# Patient Record
Sex: Male | Born: 1980 | Race: White | Hispanic: No | Marital: Married | Smoking: Former smoker
Health system: Southern US, Community
[De-identification: ages and names within clinical notes are randomized; demographics above are authoritative.]

## PROBLEM LIST (undated history)

## (undated) DIAGNOSIS — Z8619 Personal history of other infectious and parasitic diseases: Secondary | ICD-10-CM

## (undated) DIAGNOSIS — K589 Irritable bowel syndrome without diarrhea: Secondary | ICD-10-CM

## (undated) HISTORY — DX: Personal history of other infectious and parasitic diseases: Z86.19

## (undated) HISTORY — DX: Irritable bowel syndrome without diarrhea: K58.9

---

## 2003-03-03 ENCOUNTER — Emergency Department (HOSPITAL_COMMUNITY): Admission: EM | Admit: 2003-03-03 | Discharge: 2003-03-04 | Payer: Self-pay | Admitting: Emergency Medicine

## 2012-03-02 ENCOUNTER — Encounter (HOSPITAL_COMMUNITY): Payer: Self-pay | Admitting: *Deleted

## 2012-03-02 ENCOUNTER — Emergency Department (HOSPITAL_COMMUNITY)
Admission: EM | Admit: 2012-03-02 | Discharge: 2012-03-02 | Disposition: A | Discharge status: Home / Self Care | Payer: 59 | Source: Home / Self Care | Attending: Family Medicine | Admitting: Family Medicine

## 2012-03-02 DIAGNOSIS — J209 Acute bronchitis, unspecified: Secondary | ICD-10-CM

## 2012-03-02 MED ORDER — HYDROCOD POLST-CHLORPHEN POLST 10-8 MG/5ML PO LQCR: 5.0000 mL | Freq: Two times a day (BID) | ORAL | Status: DC

## 2012-03-02 MED ORDER — MOXIFLOXACIN HCL 400 MG PO TABS: 400.0000 mg | ORAL_TABLET | Freq: Every day | ORAL | Status: DC

## 2012-03-02 NOTE — ED Provider Notes (Signed)
History     CSN: [id number]  Arrival date & time 03/02/12  4098   First MD Initiated Contact with Patient 03/02/12 1006      Chief Complaint  Patient presents with  . Cough    (Consider location/radiation/quality/duration/timing/severity/associated sxs/prior treatment) Patient is a 31 y.o. male presenting with cough. The history is provided by the patient.  Cough This is a new problem. The current episode started more than 1 week ago. The problem has not changed since onset.The cough is non-productive. There has been no fever. Pertinent negatives include no rhinorrhea, no shortness of breath and no wheezing. He is not a smoker. His past medical history is significant for bronchitis.    History reviewed. No pertinent past medical history.  History reviewed. No pertinent past surgical history.  No family history on file.  History  Substance Use Topics  . Smoking status: Never Smoker   . Smokeless tobacco: Not on file  . Alcohol Use: Yes      Review of Systems  Constitutional: Negative.   HENT: Negative for congestion, rhinorrhea and postnasal drip.   Respiratory: Positive for cough. Negative for shortness of breath and wheezing.     Allergies  Penicillins  Home Medications   Current Outpatient Rx  Name  Route  Sig  Dispense  Refill  . NYQUIL PO   Oral   Take by mouth.         Marland Kitchen HYDROCOD POLST-CPM POLST ER 10-8 MG/5ML PO LQCR   Oral   Take 5 mLs by mouth every 12 (twelve) hours. For cough as needed.   115 mL   0   . MOXIFLOXACIN HCL 400 MG PO TABS   Oral   Take 1 tablet (400 mg total) by mouth daily. One tab daily   7 tablet   0     BP 134/84  Pulse 74  Temp 98.9 F (37.2 C) (Oral)  Resp 16  SpO2 98%  Physical Exam  Nursing note and vitals reviewed. Constitutional: He is oriented to person, place, and time. He appears well-developed and well-nourished.  HENT:  Head: Normocephalic.  Right Ear: External ear normal.  Left Ear: External ear  normal.  Mouth/Throat: Oropharynx is clear and moist.  Eyes: Pupils are equal, round, and reactive to light.  Neck: Normal range of motion. Neck supple.  Cardiovascular: Normal rate, normal heart sounds and intact distal pulses.   Pulmonary/Chest: Effort normal and breath sounds normal.  Abdominal: Soft. Bowel sounds are normal.  Lymphadenopathy:    He has no cervical adenopathy.  Neurological: He is alert and oriented to person, place, and time.  Skin: Skin is warm and dry.    ED Course  Procedures (including critical care time)  Labs Reviewed - No data to display No results found.   1. Bronchitis, acute       MDM          Linna Hoff, MD 03/02/12 1018

## 2012-03-02 NOTE — ED Notes (Signed)
Pt  Reports  Symptoms  Of  Cough   Congested           Started  Off  As      sorethroat  Sinus   Congestion  -  For  The  Most  Part  The  Cough  Is  Non  Productive      -  He  Is  Sitting  Upright on  Exam table  Speaking in  Complete  sentances

## 2012-08-16 ENCOUNTER — Encounter: Payer: Self-pay | Admitting: Physician Assistant

## 2012-08-16 ENCOUNTER — Ambulatory Visit (INDEPENDENT_AMBULATORY_CARE_PROVIDER_SITE_OTHER): Payer: 59 | Admitting: Physician Assistant

## 2012-08-16 VITALS — BP 98/60 | HR 64 | Temp 97.3°F | Resp 18 | Ht 70.25 in | Wt 200.0 lb

## 2012-08-16 DIAGNOSIS — K589 Irritable bowel syndrome without diarrhea: Secondary | ICD-10-CM

## 2012-08-16 NOTE — Progress Notes (Signed)
   Patient ID: Zachary Myers MRN: [id number], DOB: 1981/01/18, 32 y.o. Date of Encounter: @DATE @  Chief Complaint:  Chief Complaint  Patient presents with  . new pt est care    HPI: 32 y.o. year old male  presents to establish care. He has no active complaints really-just wanted to know that the next time he has any medical issues he can come here to be seen.  He does occasionally have times when his "stomach gets upset"-has to use bathroom quickly-but no real diarrhea. This does not happen on a regular basis-only occasionally. No abdominal pain, diarrhe, constipation, reflux on routine basis.  He recently had CPE with his work. Had lab work, was all normal. Had Tdap about 2 weeks ago.  Past Medical History  Diagnosis Date  . IBS (irritable bowel syndrome)    Has had no surgeries.    Takes no medications. Home Meds: See attached medication section for current medication list. Any medications entered into computer today will not appear on this note's list. The medications listed below were entered prior to today. No current outpatient prescriptions on file prior to visit.   No current facility-administered medications on file prior to visit.    Allergies:  Allergies  Allergen Reactions  . Penicillins Hives    History   Social History  . Marital Status: Married. Wife works as Associate Professor for [employer] Name: N/A    Number of Children: 1-Son-Infant  . Years of Education: N/A   Occupational History  . Works as Emergency planning/management officer for [employer]   Social History Main Topics  . Smoking status: Former Smoker    Types: Cigarettes    Quit date: 08/16/2009  . Smokeless tobacco: Current User    Types: Chew  . Alcohol Use: Yes-Rare.  . Drug Use: No  . Sexually Active: Yes. Married. Has Infant Son.   Other Topics Concern  . Not on file   Social History Narrative  . No narrative on file    History reviewed. No pertinent family history. Both of his parents are living  with no significant medical history. He has no siblings.   Review of Systems:  See HPI for pertinent ROS. All other ROS negative.    Physical Exam: Blood pressure 98/60, pulse 64, temperature 97.3 F (36.3 C), temperature source Oral, resp. rate 18, height 5' 10.25" (1.784 m), weight 200 lb (90.719 kg)., Body mass index is 28.5 kg/(m^2). General:WNWD WM. Appears in no acute distress. Lungs: Clear bilaterally to auscultation without wheezes, rales, or rhonchi. Breathing is unlabored. Heart: RRR with S1 S2. No murmurs, rubs, or gallops. Abdomen: Soft, non-tender, non-distended with normoactive bowel sounds. No hepatomegaly. No rebound/guarding. No obvious abdominal masses. Musculoskeletal:  Strength and tone normal for age. Extremities/Skin: Warm and dry. No clubbing or cyanosis. No edema. No rashes or suspicious lesions. Neuro: Alert and oriented X 3. Moves all extremities spontaneously. Gait is normal. CNII-XII grossly in tact. Psych:  Responds to questions appropriately with a normal affect.     ASSESSMENT AND PLAN:  32 y.o. year old male with  1. IBS (irritable bowel syndrome) Recommend take Probiotic Daily.   He recently had CPE, Complete Lab work, EKG, which was all normal so this does not need to be repeated.  Just had Tdap as well. No other immunizatons indicated now.  F/U PRN.   Signed, [address] [place], [place], [facility] 08/16/2012 9:45 AM

## 2013-08-29 ENCOUNTER — Ambulatory Visit (INDEPENDENT_AMBULATORY_CARE_PROVIDER_SITE_OTHER): Payer: 59 | Admitting: Family Medicine

## 2013-08-29 ENCOUNTER — Encounter: Payer: Self-pay | Admitting: Family Medicine

## 2013-08-29 VITALS — BP 110/70 | HR 68 | Temp 97.3°F | Resp 16 | Ht 70.0 in | Wt 199.0 lb

## 2013-08-29 DIAGNOSIS — H60399 Other infective otitis externa, unspecified ear: Secondary | ICD-10-CM

## 2013-08-29 DIAGNOSIS — H918X9 Other specified hearing loss, unspecified ear: Secondary | ICD-10-CM

## 2013-08-29 DIAGNOSIS — H6120 Impacted cerumen, unspecified ear: Secondary | ICD-10-CM

## 2013-08-29 MED ORDER — CIPROFLOXACIN-DEXAMETHASONE 0.3-0.1 % OT SUSP: 4.0000 [drp] | Freq: Two times a day (BID) | OTIC | Status: DC

## 2013-08-29 NOTE — Progress Notes (Signed)
   Subjective:    Patient ID: Zachary Myers, male    DOB: 1981-02-06, 33 y.o.   MRN: [id number]  HPI Patient reports hearing loss in his right ear and itching and pain in his left ear and some mild hearing loss. Examination of the right ear canal reveals a cerumen impaction. Damnation left ear canal reveals a partial cerumen impaction with otitis externa. Past Medical History  Diagnosis Date  . IBS (irritable bowel syndrome)    No current outpatient prescriptions on file prior to visit.   No current facility-administered medications on file prior to visit.   Allergies  Allergen Reactions  . Penicillins Hives   History   Social History  . Marital Status: Married    Spouse Name: N/A    Number of Children: N/A  . Years of Education: N/A   Occupational History  . Not on file.   Social History Main Topics  . Smoking status: Former Smoker    Types: Cigarettes    Quit date: 08/16/2009  . Smokeless tobacco: Current User    Types: Chew  . Alcohol Use: Yes  . Drug Use: No  . Sexual Activity: Yes   Other Topics Concern  . Not on file   Social History Narrative  . No narrative on file      Review of Systems  All other systems reviewed and are negative.      Objective:   Physical Exam  Vitals reviewed. HENT:  Mouth/Throat: No oropharyngeal exudate.  Eyes: Conjunctivae are normal. Pupils are equal, round, and reactive to light.  Cardiovascular: Normal rate, regular rhythm and normal heart sounds.    patient has a cerumen impaction in the right ear. He has a partial cerumen impaction in the left ear. He is otitis externa in the left ear that is visible. The cerumen impaction in the right ear was removed with irrigation. Reexamination of the right external auditory canal reveals otitis externa with inflammation and exudate on the right tympanic membrane.        Assessment & Plan:  1. Otitis, externa, infective - ciprofloxacin-dexamethasone (CIPRODEX) otic  suspension; Place 4 drops into both ears 2 (two) times daily.  Dispense: 7.5 mL; Refill: 1  2. Hearing loss secondary to cerumen impaction This resolved after removing the cerumen impaction in the right EAC.  Recheck in one week if no better or sooner if worse.

## 2013-10-19 ENCOUNTER — Ambulatory Visit (INDEPENDENT_AMBULATORY_CARE_PROVIDER_SITE_OTHER): Payer: 59 | Admitting: Physician Assistant

## 2013-10-19 ENCOUNTER — Encounter: Payer: Self-pay | Admitting: Physician Assistant

## 2013-10-19 VITALS — BP 116/78 | HR 60 | Temp 97.1°F | Resp 18 | Wt 149.0 lb

## 2013-10-19 DIAGNOSIS — H6690 Otitis media, unspecified, unspecified ear: Secondary | ICD-10-CM

## 2013-10-19 MED ORDER — CEFDINIR 300 MG PO CAPS: 300.0000 mg | ORAL_CAPSULE | Freq: Two times a day (BID) | ORAL | Status: DC

## 2013-10-20 NOTE — Progress Notes (Signed)
    Patient ID: Zachary Myers MRN: [id number], DOB: 1980-09-25, 33 y.o. Date of Encounter: 10/20/2013, 7:33 AM    Chief Complaint:  Chief Complaint  Patient presents with  . prob w/ears    bilat ear itching and pain     HPI: 33 y.o. year old white male says that both of his ears have been feeling itchy and irritated but the right one is hurting more than the left.  Has not been swimming at all. Has not been swimming in a pool or a lake. Has had no nasal congestion her mucus from the nose. No sore throat. No cough or chest congestion. No fevers or chills.     Home Meds:   Outpatient Prescriptions Prior to Visit  Medication Sig Dispense Refill  . ciprofloxacin-dexamethasone (CIPRODEX) otic suspension Place 4 drops into both ears 2 (two) times daily.  7.5 mL  1   No facility-administered medications prior to visit.    Allergies:  Allergies  Allergen Reactions  . Penicillins Hives      Review of Systems: See HPI for pertinent ROS. All other ROS negative.    Physical Exam: Blood pressure 116/78, pulse 60, temperature 97.1 F (36.2 C), temperature source Oral, resp. rate 18, weight 149 lb (67.586 kg)., Body mass index is 21.38 kg/(m^2). General: WNWD WM.  Appears in no acute distress. HEENT: Normocephalic, atraumatic, eyes without discharge, sclera non-icteric, nares are without discharge.  Oral cavity moist, posterior pharynx without exudate, erythema, peritonsillar abscess, or post nasal drip. Exam of the external ureters bilaterally is normal. Palpation of the external Ayers and tragus reveals no pain bilaterally. Bilateral ear canals have no erythema and no edema and no significant pain. Left TM has multiple small areas of white scar.  Also, Left TM is dull with effusion bubbles at inferior portion. Right TM with red erythema.  Neck: Supple. No thyromegaly. No lymphadenopathy. Lungs: Clear bilaterally to auscultation without wheezes, rales, or rhonchi. Breathing is  unlabored. Heart: Regular rhythm. No murmurs, rubs, or gallops. Msk:  Strength and tone normal for age. Extremities/Skin: Warm and dry. Neuro: Alert and oriented X 3. Moves all extremities spontaneously. Gait is normal. CNII-XII grossly in tact. Psych:  Responds to questions appropriately with a normal affect.     ASSESSMENT AND PLAN:  33 y.o. year old male with  1. Otitis media, recurrence not specified, unspecified chronicity, unspecified laterality, unspecified otitis media type Patient has penicillin allergy so have to avoid amoxicillin and Augmentin. Try Omnicef and hope that he does not have reaction with this. He is to complete all of the antibiotic. F/U if symptoms do not resolve with completion of antibiotic. - cefdinir (OMNICEF) 300 MG capsule; Take 1 capsule (300 mg total) by mouth 2 (two) times daily.  Dispense: 14 capsule; Refill: 0   Signed, [address] Myers, [place], [facility] 10/20/2013 7:33 AM

## 2013-12-31 ENCOUNTER — Emergency Department (HOSPITAL_COMMUNITY)
Admission: EM | Admit: 2013-12-31 | Discharge: 2013-12-31 | Disposition: A | Discharge status: Home / Self Care | Payer: 59 | Source: Home / Self Care | Attending: Family Medicine | Admitting: Family Medicine

## 2013-12-31 ENCOUNTER — Encounter (HOSPITAL_COMMUNITY): Payer: Self-pay | Admitting: Emergency Medicine

## 2013-12-31 DIAGNOSIS — H6091 Unspecified otitis externa, right ear: Secondary | ICD-10-CM

## 2013-12-31 MED ORDER — ACETIC ACID 2 % OT SOLN: 4.0000 [drp] | Freq: Three times a day (TID) | OTIC | Status: DC

## 2013-12-31 MED ORDER — NEOMYCIN-POLYMYXIN-HC 3.5-10000-1 OT SUSP: 4.0000 [drp] | Freq: Three times a day (TID) | OTIC | Status: DC

## 2013-12-31 NOTE — ED Notes (Signed)
Assessment per Dr. Corey. 

## 2013-12-31 NOTE — Discharge Instructions (Signed)
Thank you for coming in today. Use both the acetic acid ear drops and the Cortisporin ear drops. [facility]  Otitis Externa Otitis externa is a bacterial or fungal infection of the outer ear canal. This is the area from the eardrum to the outside of the ear. Otitis externa is sometimes called "swimmer's ear." CAUSES  Possible causes of infection include:  Swimming in dirty water.  Moisture remaining in the ear after swimming or bathing.  Mild injury (trauma) to the ear.  Objects stuck in the ear (foreign body).  Cuts or scrapes (abrasions) on the outside of the ear. SIGNS AND SYMPTOMS  The first symptom of infection is often itching in the ear canal. Later signs and symptoms may include swelling and redness of the ear canal, ear pain, and yellowish-white fluid (pus) coming from the ear. The ear pain may be worse when pulling on the earlobe. DIAGNOSIS  Your health care provider will perform a physical exam. A sample of fluid may be taken from the ear and examined for bacteria or fungi. TREATMENT  Antibiotic ear drops are often given for 10 to 14 days. Treatment may also include pain medicine or corticosteroids to reduce itching and swelling. HOME CARE INSTRUCTIONS   Apply antibiotic ear drops to the ear canal as prescribed by your health care provider.  Take medicines only as directed by your health care provider.  If you have diabetes, follow any additional treatment instructions from your health care provider.  Keep all follow-up visits as directed by your health care provider. PREVENTION   Keep your ear dry. Use the corner of a towel to absorb water out of the ear canal after swimming or bathing.  Avoid scratching or putting objects inside your ear. This can damage the ear canal or remove the protective wax that lines the canal. This makes it easier for bacteria and fungi to grow.  Avoid swimming in lakes, polluted water, or poorly  chlorinated pools.  You may use ear drops made of rubbing alcohol and vinegar after swimming. Combine equal parts of white vinegar and alcohol in a bottle. Put 3 or 4 drops into each ear after swimming. SEEK MEDICAL CARE IF:   You have a fever.  Your ear is still red, swollen, painful, or draining pus after 3 days.  Your redness, swelling, or pain gets worse.  You have a severe headache.  You have redness, swelling, pain, or tenderness in the area behind your ear. MAKE SURE YOU:   Understand these instructions.  Will watch your condition.  Will get help right away if you are not doing well or get worse. Document Released: 03/17/2005 Document Revised: 08/01/2013 Document Reviewed: 04/03/2011 [facility] Patient Information 2015 [employer] [place]. This information is not intended to replace advice given to you by your health care provider. Make sure you discuss any questions you have with your health care provider.

## 2013-12-31 NOTE — ED Provider Notes (Signed)
Zachary Myers is a 33 y.o. male who presents to Urgent Care today for right ear pain. Patient has had a one-day history of right ear pain. He's had multiple recurrent episodes of external ear infections over the past 6 months or so. His current symptoms are similar to his previous symptoms. 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 No change in hearing. No fevers or chills.   Past Medical History  Diagnosis Date  . IBS (irritable bowel syndrome)    History  Substance Use Topics  . Smoking status: Former Smoker    Types: Cigarettes    Quit date: 08/16/2009  . Smokeless tobacco: Current User    Types: Chew  . Alcohol Use: Yes   ROS as above Medications: No current facility-administered medications for this encounter.   Current Outpatient Prescriptions  Medication Sig Dispense Refill  . acetic acid (VOSOL) 2 % otic solution Place 4 drops into the right ear 3 (three) times daily.  15 mL  0  . neomycin-polymyxin-hydrocortisone (CORTISPORIN) 3.5-10000-1 otic suspension Place 4 drops into the right ear 3 (three) times daily.  10 mL  0    Exam:  BP 123/79  Pulse 67  Temp(Src) 97.8 F (36.6 C) (Oral)  Resp 18  SpO2 97% Gen: Well NAD HEENT: EOMI,  MMM EAR canal is erythematous with whitish material. Tympanic membrane is normal appearing. Pain with motion of the external ear. Mastoids are nontender bilaterally. Lungs: Normal work of breathing. CTABL Heart: RRR no MRG Abd: NABS, Soft. Nondistended, Nontender Exts: Brisk capillary refill, warm and well perfused.   No results found for this or any previous visit (from the past 24 hour(s)). No results found.  Assessment and Plan: 33 y.o. male with otitis externa. Plan to treat with acetic as a solution and neomycin polymyxin hydrocortisone solution. Followup with ear nose and throat.  Discussed warning signs or symptoms. Please see discharge instructions. Patient expresses  understanding.     Rodolph BongEvan S Eryka Dolinger, MD 12/31/13 [phone]

## 2014-02-02 ENCOUNTER — Ambulatory Visit (INDEPENDENT_AMBULATORY_CARE_PROVIDER_SITE_OTHER): Payer: 59 | Admitting: Physician Assistant

## 2014-02-02 ENCOUNTER — Encounter: Payer: Self-pay | Admitting: Physician Assistant

## 2014-02-02 VITALS — BP 106/64 | HR 60 | Temp 97.8°F | Resp 18 | Ht 70.5 in | Wt 202.0 lb

## 2014-02-02 DIAGNOSIS — M545 Low back pain, unspecified: Secondary | ICD-10-CM

## 2014-02-02 MED ORDER — METAXALONE 800 MG PO TABS: 800.0000 mg | ORAL_TABLET | Freq: Four times a day (QID) | ORAL | Status: DC

## 2014-02-02 MED ORDER — NAPROXEN 375 MG PO TABS: 375.0000 mg | ORAL_TABLET | Freq: Two times a day (BID) | ORAL | Status: DC

## 2014-02-02 MED ORDER — TRAMADOL HCL 50 MG PO TABS: ORAL_TABLET | ORAL | Status: DC

## 2014-02-02 NOTE — Progress Notes (Signed)
Patient ID: Zachary Myers MRN: [id number], DOB: September 07, 1980, 33 y.o. Date of Encounter: 02/02/2014, 10:18 AM    Chief Complaint:  Chief Complaint  Patient presents with  . c/o back spasms    x 2 days, soreness x 2 weeks     HPI: 33 y.o. year old white male reports he has no prior history of evaluations for back pain. Has never had any significant low back pain prior to this episode.  He works as a Emergency planning/management officerpolice officer. States that he does no exercise. States that he did no lifting or activity that he thinks would have cyclically been the cause of the symptoms.  For the past 2 weeks he has been having some mild discomfort across both sides of his low back. For the past 2 days, even with walking, he will experience sharp stabbing pain in his left low back. Also says that if he has been lying down and then sit gets up he will also feel pain in the left low back at that time as well. The past 2 days he has spent half the day lying on a heating pad.  He is off work today.  He has had no pain numbness or tingling or weakness down either leg or foot.  His wife and young son are with him today. His wife works at the inpatient pharmacy [facility] [employer].    Home Meds:   Outpatient Prescriptions Prior to Visit  Medication Sig Dispense Refill  . acetic acid (VOSOL) 2 % otic solution Place 4 drops into the right ear 3 (three) times daily. 15 mL 0  . neomycin-polymyxin-hydrocortisone (CORTISPORIN) 3.5-10000-1 otic suspension Place 4 drops into the right ear 3 (three) times daily. 10 mL 0   No facility-administered medications prior to visit.    Allergies:  Allergies  Allergen Reactions  . Penicillins Hives      Review of Systems: See HPI for pertinent ROS. All other ROS negative.    Physical Exam: Blood pressure 106/64, pulse 60, temperature 97.8 F (36.6 C), temperature source Oral, resp. rate 18, height 5' 10.5" (1.791 m), weight 202 lb (91.627 kg)., Body mass index is 28.56  kg/(m^2). General: WNWD WM.  Appears in no acute distress. Neck: Supple. No thyromegaly. No lymphadenopathy. Lungs: Clear bilaterally to auscultation without wheezes, rales, or rhonchi. Breathing is unlabored. Heart: Regular rhythm. No murmurs, rubs, or gallops. Msk:  Strength and tone normal for age. Very minimal tenderness with palpation of the low back. Left straight leg raise is normal. Left hip abduction-he cannot abduct further than 15 secondary to pain in the left low back. Right straight leg raise he can raise to 60 but then he says he feels a pulling sensation in bilateral low back. Right hip abduction--he can fully abduct but at the end of abduction says that he is feeling a pulling sensation across bilateral low back. Extremities/Skin: Warm and dry. No rashes.  Neuro: Alert and oriented X 3. Moves all extremities spontaneously. Gait is normal. CNII-XII grossly in tact. Psych:  Responds to questions appropriately with a normal affect.     ASSESSMENT AND PLAN:  33 y.o. year old male with  1. Bilateral low back pain without sciatica  He says that he was prescribed Naprosyn and Flexeril 5 mg in April. Says that he has been taking some of those Naprosyn and also taking 2 of the Flexeril is at a time but getting minimal relief. We'll try Skelaxin to see if this is more beneficial  for him. Also will give him some tramadol to use today to see if he can get some relief. 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 He is to continue heat. He is to slowly do stretches--I told him several specific stretches he can do including lying on his back and pulling his knees into his chest and also forward bending touching toes. Discussed obtaining x-ray of lumbar spine. We'll hold off on this for now but if his symptoms are not improving in a few days he is to call us and I will order this.    - metaxalone (SKELAXIN) 800 MG tablet; Take 1 tablet  (800 mg total) by mouth 4 (four) times daily.  Dispense: 30 tablet; Refill: 2 - traMADol (ULTRAM) 50 MG tablet; 1 - 2 every 8 hours as needed for pain.  Dispense: 40 tablet; Refill: 0 - naproxen (NAPROSYN) 375 MG tablet; Take 1 tablet (375 mg total) by mouth 2 (two) times daily with a meal.  Dispense: 60 tablet; Refill: 3   Signed, [address]Mary Beth CarawayDixon, [place], [facility] 02/02/2014 10:18 AM

## 2014-07-15 ENCOUNTER — Emergency Department (HOSPITAL_COMMUNITY)
Admission: EM | Admit: 2014-07-15 | Discharge: 2014-07-15 | Disposition: A | Discharge status: Home / Self Care | Payer: 59 | Source: Home / Self Care | Attending: Family Medicine | Admitting: Family Medicine

## 2014-07-15 ENCOUNTER — Encounter (HOSPITAL_COMMUNITY): Payer: Self-pay

## 2014-07-15 DIAGNOSIS — H6091 Unspecified otitis externa, right ear: Secondary | ICD-10-CM | POA: Diagnosis not present

## 2014-07-15 MED ORDER — SULFAMETHOXAZOLE-TRIMETHOPRIM 800-160 MG PO TABS
1.0000 | ORAL_TABLET | Freq: Two times a day (BID) | ORAL | Status: AC
Start: 2014-07-15 — End: 2014-07-22

## 2014-07-15 MED ORDER — FLUTICASONE PROPIONATE 50 MCG/ACT NA SUSP: 1.0000 | Freq: Two times a day (BID) | NASAL | Status: DC

## 2014-07-15 NOTE — ED Provider Notes (Addendum)
CSN: [id number]     Arrival date & time 07/15/14  1416 History   First MD Initiated Contact with Patient 07/15/14 1539     Chief Complaint  Patient presents with  . Ear Problem   (Consider location/radiation/quality/duration/timing/severity/associated sxs/prior Treatment) Patient is a 34 y.o. male presenting with ear pain. The history is provided by the patient.  Otalgia Location:  Right Behind ear:  No abnormality Quality:  Throbbing Severity:  Mild Onset quality:  Gradual Duration:  1 week Progression:  Worsening Chronicity:  Recurrent Associated symptoms: no ear discharge and no fever   Risk factors: chronic ear infection   Risk factors comment:  Pt of dr Emeline Darlinggore, seen for recurrent ext otitis.   Past Medical History  Diagnosis Date  . IBS (irritable bowel syndrome)    History reviewed. No pertinent past surgical history. History reviewed. No pertinent family history. History  Substance Use Topics  . Smoking status: Former Smoker    Types: Cigarettes    Quit date: 08/16/2009  . Smokeless tobacco: Former NeurosurgeonUser  . Alcohol Use: No    Review of Systems  Constitutional: Negative.  Negative for fever.  HENT: Positive for ear pain. Negative for ear discharge.     Allergies  Penicillins  Home Medications   Prior to Admission medications   Medication Sig Start Date End Date Taking? Authorizing Provider  clotrimazole (LOTRIMIN) 1 % external solution Apply topically 2 (two) times daily. 4 drops both ears twice a day    Historical Provider, MD  fluticasone (FLONASE) 50 MCG/ACT nasal spray Place 1 spray into both nostrils 2 (two) times daily. 07/15/14   Linna HoffJames D Mont Jagoda, MD  metaxalone (SKELAXIN) 800 MG tablet Take 1 tablet (800 mg total) by mouth 4 (four) times daily. 02/02/14   Dorena BodoMary B Dixon, PA-C  naproxen (NAPROSYN) 375 MG tablet Take 1 tablet (375 mg total) by mouth 2 (two) times daily with a meal. 02/02/14   Dorena BodoMary B Dixon, PA-C  sulfamethoxazole-trimethoprim (BACTRIM DS,SEPTRA  DS) 800-160 MG per tablet Take 1 tablet by mouth 2 (two) times daily. 07/15/14 07/22/14  Linna HoffJames D Forest Redwine, MD  traMADol (ULTRAM) 50 MG tablet 1 - 2 every 8 hours as needed for pain. 02/02/14   Patriciaann ClanMary B Dixon, PA-C   BP 128/89 mmHg  Pulse 74  Temp(Src) 98.9 F (37.2 C) (Oral)  Resp 14  SpO2 99% Physical Exam  Constitutional: He appears well-developed and well-nourished.  HENT:  Head: Normocephalic.  Right Ear: External ear and ear canal normal. No drainage, swelling or tenderness. Tympanic membrane is not erythematous. A middle ear effusion is present. No decreased hearing is noted.  Left Ear: Tympanic membrane, external ear and ear canal normal.  Mouth/Throat: Oropharynx is clear and moist.  Eyes: Conjunctivae are normal. Pupils are equal, round, and reactive to light.  Neck: Normal range of motion. Neck supple.  Lymphadenopathy:    He has no cervical adenopathy.  Skin: Skin is warm and dry.  Nursing note and vitals reviewed.   ED Course  Procedures (including critical care time) Labs Review Labs Reviewed - No data to display  Imaging Review No results found.   MDM   1. Otitis externa of right ear        Linna HoffJames D Laroy Mustard, MD 07/15/14 1605  Linna HoffJames D Yaiza Palazzola, MD 07/16/14 [phone]

## 2014-07-15 NOTE — Discharge Instructions (Signed)
Take medicine as prescribed, see your doctor if further problems. °

## 2014-07-15 NOTE — ED Notes (Signed)
C/o another ear infection. Has been using Rx ear drops from his ENT x 1 week, but now developing pain in his ear

## 2016-03-28 ENCOUNTER — Encounter: Payer: Self-pay | Admitting: Family Medicine

## 2016-03-28 ENCOUNTER — Ambulatory Visit (INDEPENDENT_AMBULATORY_CARE_PROVIDER_SITE_OTHER): Payer: Commercial Managed Care - HMO | Admitting: Family Medicine

## 2016-03-28 VITALS — BP 118/76 | HR 76 | Temp 98.8°F | Resp 14 | Ht 70.5 in | Wt 213.0 lb

## 2016-03-28 DIAGNOSIS — J209 Acute bronchitis, unspecified: Secondary | ICD-10-CM | POA: Diagnosis not present

## 2016-03-28 MED ORDER — AZITHROMYCIN 250 MG PO TABS: ORAL_TABLET | ORAL | 0 refills | Status: DC

## 2016-03-28 MED ORDER — HYDROCODONE-HOMATROPINE 5-1.5 MG/5ML PO SYRP: 5.0000 mL | ORAL_SOLUTION | Freq: Three times a day (TID) | ORAL | 0 refills | Status: DC | PRN

## 2016-03-28 MED FILL — HYDROCODONE-HOMATROPINE SYR: 5-1.5 | 8 days supply | Qty: 120 | Fill #0

## 2016-03-28 MED FILL — AZITHROMYCIN 250 MG TABLET: 250 | 5 days supply | Qty: 6 | Fill #0

## 2016-03-28 NOTE — Progress Notes (Signed)
   Subjective:    Patient ID: Zachary Myers, male    DOB: November 08, 1980, 35 y.o.   MRN: [id number]  HPI Symptoms began 7 days ago. Symptoms consist of a cough productive of yellow sputum which is worse at night. He also reports postnasal drainage and sinus congestion. He denies any fevers. The coughing can become so bad that he develops spasms that will not stop. He denies any hemoptysis chest pain or shortness of breath Past Medical History:  Diagnosis Date  . IBS (irritable bowel syndrome)    No past surgical history on file. No current outpatient prescriptions on file prior to visit.   No current facility-administered medications on file prior to visit.    Allergies  Allergen Reactions  . Penicillins Hives   Social History   Social History  . Marital status: Married    Spouse name: N/A  . Number of children: N/A  . Years of education: N/A   Occupational History  . Not on file.   Social History Main Topics  . Smoking status: Former Smoker    Types: Cigarettes    Quit date: 08/16/2009  . Smokeless tobacco: Former NeurosurgeonUser  . Alcohol use No  . Drug use: No  . Sexual activity: Not on file   Other Topics Concern  . Not on file   Social History Narrative  . No narrative on file      Review of Systems  All other systems reviewed and are negative.      Objective:   Physical Exam  Constitutional: He appears well-developed and well-nourished.  HENT:  Right Ear: External ear normal.  Left Ear: External ear normal.  Nose: Nose normal.  Mouth/Throat: Oropharynx is clear and moist. No oropharyngeal exudate.  Eyes: Conjunctivae are normal.  Neck: Neck supple.  Cardiovascular: Normal rate, regular rhythm and normal heart sounds.   Pulmonary/Chest: Effort normal and breath sounds normal. No respiratory distress. He has no wheezes. He has no rales.  Abdominal: Soft. Bowel sounds are normal.  Lymphadenopathy:    He has no cervical adenopathy.  Vitals reviewed.         Assessment & Plan:  Acute bronchitis, unspecified organism - Plan: azithromycin (ZITHROMAX) 250 MG tablet, HYDROcodone-homatropine (HYCODAN) 5-1.5 MG/5ML syrup  Patient has viral bronchitis. I recommended tincture of time. He can use Hycodan 1 teaspoon every 6-8 hours as needed for cough. I anticipate gradual improvement over the next 3-4 days. Because of going into the new year weekend, I did give him a Z-Pak with strict instructions not to fill unless he develops a high fever, pleurisy, and purulent sputum. He understands these instructions and will not get the antibiotic and was much worse

## 2016-11-20 ENCOUNTER — Encounter: Payer: Self-pay | Admitting: Physician Assistant

## 2016-11-20 ENCOUNTER — Ambulatory Visit (INDEPENDENT_AMBULATORY_CARE_PROVIDER_SITE_OTHER): Payer: 59 | Admitting: Physician Assistant

## 2016-11-20 VITALS — BP 108/80 | HR 88 | Temp 97.9°F | Resp 16 | Ht 71.0 in | Wt 203.8 lb

## 2016-11-20 DIAGNOSIS — G8929 Other chronic pain: Secondary | ICD-10-CM

## 2016-11-20 DIAGNOSIS — Z0000 Encounter for general adult medical examination without abnormal findings: Secondary | ICD-10-CM

## 2016-11-20 DIAGNOSIS — M545 Low back pain, unspecified: Secondary | ICD-10-CM

## 2016-11-20 DIAGNOSIS — M778 Other enthesopathies, not elsewhere classified: Secondary | ICD-10-CM

## 2016-11-20 DIAGNOSIS — M7581 Other shoulder lesions, right shoulder: Secondary | ICD-10-CM

## 2016-11-20 MED ORDER — CYCLOBENZAPRINE HCL 10 MG PO TABS: 10.0000 mg | ORAL_TABLET | Freq: Three times a day (TID) | ORAL | 0 refills | Status: DC | PRN

## 2016-11-20 MED FILL — CYCLOBENZAPRINE 10 MG TAB: 10 | 10 days supply | Qty: 30 | Fill #0

## 2016-11-20 NOTE — Progress Notes (Signed)
Patient ID: Zachary Myers MRN: [id number], DOB: 10-10-1980 36 y.o. Date of Encounter: 11/20/2016, 3:29 PM    Chief Complaint: Physical (CPE)  HPI: 36 y.o. y/o male here for CPE.   Reviewed in chart that he was in the [employer] until 2002. Has been working with the [employer] since then. Today he reports that he does still work with the [employer].  He does have a couple of things that he wanted to address today. Otherwise is here for CPE/preventive care.  Reports that he has been having some problem with his right shoulder off and on. Says that for 2 or 3 years it would occasionally bother him about once a year. However usually it would flare and then would resolve. This time it is continuing "to stick around ". Says that he feels a very slight ache there all the time but then with certain positions has increased discomfort. Asked what certain things he is doing when he notices that. Mostly if he is doing weights and abducting his arm. It does bother him some with sleep. Has not caused any decreased range of motion.  He also asked if it is ok to occasionally use the muscle relaxer that I had prescribed for his low back pain about 4 years ago.  He has no other specific concerns to address.  Review of Systems: Consitutional: No fever, chills, fatigue, night sweats, lymphadenopathy, or weight changes. Eyes: No visual changes, eye redness, or discharge. ENT/Mouth: Ears: No otalgia, tinnitus, hearing loss, discharge. Nose: No congestion, rhinorrhea, sinus pain, or epistaxis. Throat: No sore throat, post nasal drip, or teeth pain. Cardiovascular: No CP, palpitations, diaphoresis, DOE, edema, orthopnea, PND. Respiratory: No cough, hemoptysis, SOB, or wheezing. Gastrointestinal: No anorexia, dysphagia, reflux, pain, nausea, vomiting, hematemesis, diarrhea, constipation, BRBPR, or melena. Genitourinary: No dysuria, frequency, urgency, hematuria, incontinence, nocturia,  decreased urinary stream, discharge, impotence, or testicular pain/masses. Musculoskeletal: No decreased ROM, myalgias, stiffness, joint swelling, or weakness. Skin: No rash, erythema, lesion changes, pain, warmth, jaundice, or pruritis. Neurological: No headache, dizziness, syncope, seizures, tremors, memory loss, coordination problems, or paresthesias. Psychological: No anxiety, depression, hallucinations, SI/HI. Endocrine: No fatigue, polydipsia, polyphagia, polyuria, or known diabetes. All other systems were reviewed and are otherwise negative.  Past Medical History:  Diagnosis Date  . IBS (irritable bowel syndrome)      History reviewed. No pertinent surgical history.  Home Meds:  Outpatient Medications Prior to Visit  Medication Sig Dispense Refill  . azithromycin (ZITHROMAX) 250 MG tablet 2 tabs poqday1, 1 tab poqday 2-5 6 tablet 0  . guaiFENesin (MUCINEX) 600 MG 12 hr tablet Take by mouth 2 (two) times daily.    Marland Kitchen HYDROcodone-homatropine (HYCODAN) 5-1.5 MG/5ML syrup Take 5 mLs by mouth every 8 (eight) hours as needed for cough. 120 mL 0  . pseudoephedrine (SUDAFED) 30 MG tablet Take 30 mg by mouth every 4 (four) hours as needed for congestion.     No facility-administered medications prior to visit.     Allergies:  Allergies  Allergen Reactions  . Penicillins Hives    Social History   Social History  . Marital status: Married    Spouse name: N/A  . Number of children: N/A  . Years of education: N/A   Occupational History  . Not on file.   Social History Main Topics  . Smoking status: Former Smoker    Types: Cigarettes    Quit date: 08/16/2009  . Smokeless tobacco: Former Neurosurgeon  . Alcohol use No  .  Drug use: No  . Sexual activity: Not on file   Other Topics Concern  . Not on file   Social History Narrative  . No narrative on file    History reviewed. No pertinent family history.  Physical Exam: Blood pressure 108/80, pulse 88, temperature 97.9 F  (36.6 C), temperature source Oral, resp. rate 16, height 5\' 11"  (1.803 m), weight 203 lb 12.8 oz (92.4 kg), SpO2 98 %.  General: Well developed, well nourished WM. Appears in no acute distress. HEENT: Normocephalic, atraumatic. Conjunctiva pink, sclera non-icteric. Pupils 2 mm constricting to 1 mm, round, regular, and equally reactive to light and accomodation. EOMI. Internal auditory canal clear. TMs with good cone of light and without pathology. Nasal mucosa pink. Nares are without discharge. No sinus tenderness. Oral mucosa pink. Neck: Supple. Trachea midline. No thyromegaly. Full ROM. No lymphadenopathy. Lungs: Clear to auscultation bilaterally without wheezes, rales, or rhonchi. Breathing is of normal effort and unlabored. Cardiovascular: RRR with S1 S2. No murmurs, rubs, or gallops. Distal pulses 2+ symmetrically. No carotid or abdominal bruits. Abdomen: Soft, non-tender, non-distended with normoactive bowel sounds. No hepatosplenomegaly or masses. No rebound/guarding. No CVA tenderness. No hernias. Musculoskeletal: Full range of motion and 5/5 strength throughout.  Right shoulder:  5/5 strength with forearm abduction and adduction Range of motion of the shoulder is intact. When he abducts this causes some discomfort in the anterior aspect of the shoulder. Skin: Warm and moist without erythema, ecchymosis, wounds, or rash. Neuro: A+Ox3. CN II-XII grossly intact. Moves all extremities spontaneously. Full sensation throughout. Normal gait.  Psych:  Responds to questions appropriately with a normal affect.   Assessment/Plan:  36 y.o. y/o  male here  male here for CPE  -1. Encounter for preventive health examination  A. Screening Labs: He is not fasting today but reports that he can return fasting tomorrow morning for labs. - CBC with Differential/Platelet - COMPLETE METABOLIC PANEL WITH GFR - Lipid panel - TSH - HIV antibody  B. Screening For Prostate Cancer: --No indication to need this until  age 12  C. Screening For Colorectal Cancer:  --No indication to need this until age 60  D. Immunizations: Flu----------------------N/A Tetanus-------------- Up to date. He received T dap 11/25/2014. Pneumococcal------he has no indication to require a pneumonia vaccine until age 64 Shingrix---------------Discuss at age 14   2. Chronic low back pain without sciatica, unspecified back pain laterality Reminded him that Flexeril causes some people to be drowsy. If causes drowsiness then only take when has 8 hours is to sleep. 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 - cyclobenzaprine (FLEXERIL) 10 MG tablet; Take 1 tablet (10 mg total) by mouth 3 (three) times daily as needed for muscle spasms.  Dispense: 30 tablet; Refill: 0  3. Right shoulder tendinitis I gave him a handout with stretches to do. He is to do the stretches on a routine basis. If symptoms worsen or do not resolve then we'll follow-up for possible injection.  Signed:   [address] [place], [place]  11/20/2016 3:29 PM

## 2016-11-21 ENCOUNTER — Other Ambulatory Visit: Payer: 59

## 2016-11-21 LAB — CBC WITH DIFFERENTIAL/PLATELET
Basophils Absolute: 0 cells/uL (ref 0–200)
Basophils Relative: 0 %
Eosinophils Absolute: 44 cells/uL (ref 15–500)
Eosinophils Relative: 1 %
HCT: 45 % (ref 38.5–50.0)
Hemoglobin: 15.2 g/dL (ref 13.0–17.0)
Lymphocytes Relative: 44 %
Lymphs Abs: 1936 cells/uL (ref 850–3900)
MCH: 29.6 pg (ref 27.0–33.0)
MCHC: 33.8 g/dL (ref 32.0–36.0)
MCV: 87.5 fL (ref 80.0–100.0)
MPV: 9.3 fL (ref 7.5–12.5)
Monocytes Absolute: 440 cells/uL (ref 200–950)
Monocytes Relative: 10 %
Neutro Abs: 1980 cells/uL (ref 1500–7800)
Neutrophils Relative %: 45 %
Platelets: 154 10*3/uL (ref 140–400)
RBC: 5.14 MIL/uL (ref 4.20–5.80)
RDW: 13.8 % (ref 11.0–15.0)
WBC: 4.4 10*3/uL (ref 3.8–10.8)

## 2016-11-22 LAB — COMPLETE METABOLIC PANEL WITH GFR
ALT: 14 U/L (ref 9–46)
AST: 19 U/L (ref 10–40)
Albumin: 4.7 g/dL (ref 3.6–5.1)
Alkaline Phosphatase: 79 U/L (ref 40–115)
BUN: 15 mg/dL (ref 7–25)
CO2: 22 mmol/L (ref 20–32)
Calcium: 9.2 mg/dL (ref 8.6–10.3)
Chloride: 102 mmol/L (ref 98–110)
Creat: 0.9 mg/dL (ref 0.60–1.35)
GFR, Est African American: >89 mL/min (ref >=60)
GFR, Est Non African American: >89 mL/min (ref >=60)
Glucose, Bld: 88 mg/dL (ref 70–99)
Potassium: 4.2 mmol/L (ref 3.5–5.3)
Sodium: 138 mmol/L (ref 135–146)
Total Bilirubin: 0.5 mg/dL (ref 0.2–1.2)
Total Protein: 6.9 g/dL (ref 6.1–8.1)

## 2016-11-22 LAB — LIPID PANEL
Cholesterol: 179 mg/dL (ref <200)
HDL: 43 mg/dL (ref >40)
LDL Cholesterol: 122 mg/dL — ABNORMAL HIGH (ref <100)
Total CHOL/HDL Ratio: 4.2 Ratio (ref <5.0)
Triglycerides: 70 mg/dL (ref <150)
VLDL: 14 mg/dL (ref <30)

## 2016-11-22 LAB — TSH: TSH: 1.91 mIU/L (ref 0.40–4.50)

## 2016-11-22 LAB — HIV ANTIBODY (ROUTINE TESTING W REFLEX): HIV 1&2 Ab, 4th Generation: NONREACTIVE

## 2017-08-04 ENCOUNTER — Ambulatory Visit: Payer: 59 | Admitting: Family Medicine

## 2017-08-10 ENCOUNTER — Ambulatory Visit: Payer: 59 | Admitting: Family Medicine

## 2017-08-14 ENCOUNTER — Ambulatory Visit: Payer: 59 | Admitting: Family Medicine

## 2017-08-14 ENCOUNTER — Encounter: Payer: Self-pay | Admitting: Family Medicine

## 2017-08-14 VITALS — BP 125/78 | HR 70 | Temp 98.3°F | Resp 12 | Ht 71.0 in | Wt 200.5 lb

## 2017-08-14 DIAGNOSIS — L309 Dermatitis, unspecified: Secondary | ICD-10-CM | POA: Diagnosis not present

## 2017-08-14 DIAGNOSIS — Z13220 Encounter for screening for lipoid disorders: Secondary | ICD-10-CM

## 2017-08-14 DIAGNOSIS — M25561 Pain in right knee: Secondary | ICD-10-CM

## 2017-08-14 DIAGNOSIS — G8929 Other chronic pain: Secondary | ICD-10-CM | POA: Diagnosis not present

## 2017-08-14 DIAGNOSIS — R5382 Chronic fatigue, unspecified: Secondary | ICD-10-CM

## 2017-08-14 MED ORDER — HYDROCORTISONE 2.5 % RE CREA: 1.0000 "application " | TOPICAL_CREAM | Freq: Every day | RECTAL | 1 refills | Status: DC | PRN

## 2017-08-14 NOTE — Patient Instructions (Addendum)
A few things to remember from today's visit:   Perianal dermatitis - Plan: hydrocortisone (ANUSOL-HC) 2.5 % rectal cream  Chronic pain of right knee  Chronic fatigue - Plan: Comprehensive metabolic panel, TSH, Testosterone, VITAMIN D 25 Hydroxy (Vit-D Deficiency, Fractures), CBC  Screening for lipid disorders - Plan: Lipid panel  Perianal Dermatitis, Adult Perianal dermatitis is inflammation of the skin around the anus. This condition causes patches of red, irritated skin that may be itchy or painful. It can be passed from one person to another (contagious), depending on what caused it. What are the causes? This condition may be caused by:  Contact with an irritant, such as stool, urine, mucus, soap, or sweat.  Bacteria, especially certain types of strep (streptococcal) bacteria.  Hemorrhoids.  Fungus.  Skin conditions, such as psoriasis.  Medical conditions, such as diabetes and Crohn disease.  Certain foods.  An opening between the rectum and the skin around the anus (fistula).  What increases the risk? This condition is more likely to develop in:  People who are unable to control when they go to the bathroom (have incontinence).  People who have trouble walking or moving around (mobility problems).  People with thin or fragile skin.  People taking oral antibiotic medicines or steroids.  What are the signs or symptoms? Itchy, red patches of skin are the main symptom of this condition. The skin in this area may also be swollen and have:  Cracks or tears (fissures).  Small, raised bumps (papules).  Small, pus-filled blisters (pustules).  Other symptoms include:  Pain when passing stools.  Blood in the stool.  Itching around the anus.  Tenderness around the anus.  Holding back stools to avoid pain (constipation).  How is this diagnosed? This condition is diagnosed with a medical history and physical exam. The diagnosis is confirmed by testing a sample  of the skin for bacteria or fungus. How is this treated? Treatment for this condition depends on the cause. Mild cases may go away without treatment when contact with the irritant is stopped. Treatment for more severe cases of perianal dermatitis may include medicines, such as:  A waterproof barrier cream.  Topical or oral corticosteroids to ease skin inflammation.  Antihistamines to relieve itching.  Antibiotics to treat a bacterial infection.  Antifungal cream to treat a fungal infection. Severe fungal infections may also be treated with topical steroids and medicine to control itching.  Follow these instructions at home:  Take over-the-counter and prescription medicines only as told by your health care provider.  Apply prescribed or suggested creams only as told by your health care provider.  If you were prescribed an antibiotic, take it as told by your health care provider. Do not stop taking the antibiotic even if your condition improves.  Do not scratch the affected area.  Wash your hands carefully with soap and warm water after each time that you use the bathroom. If soap and water are not available, use hand sanitizer. Make sure other people in your household wash their hands frequently as well.  Keep all follow-up visits as told by your health care provider. This is important. How is this prevented?  Avoid contact with any substances that cause perianal inflammation.  Keep the perianal area clean and dry. Contact a health care provider if:  Your symptoms do not improve with treatment.  Your symptoms get worse.  Your symptoms go away and then return.  You are not able to control your bowel movements or you are  leaking stool (have fecal incontinence).  You have a fever. Get help right away if:  You frequently pass blood in your stools.  You lose weight and you do not know why.  You have fluid, blood, or pus coming from the rash site. Summary  Perianal  dermatitis is inflammation of the skin around the anus. This condition causes patches of red, irritated skin that may be itchy or painful.  Itchy, red patches of skin are the main symptom of this condition.  Treatment for this condition depends on the cause. Mild cases may go away without treatment when contact with the irritant is stopped. Treatment for more severe cases of perianal dermatitis may include medicines.  Wash your hands carefully with soap and warm water after each time that you use the bathroom. If soap and water are not available, use hand sanitizer.  Keep the perianal area clean and dry to prevent this condition. This information is not intended to replace advice given to you by your health care provider. Make sure you discuss any questions you have with your health care provider. Document Released: 04/06/2007 Document Revised: 05/29/2016 Document Reviewed: 05/29/2016 Elsevier Interactive Patient Education  2018 [employer].   Please be sure medication list is accurate. If a new problem present, please set up appointment sooner than planned today.

## 2017-08-14 NOTE — Progress Notes (Signed)
HPI:   Zachary Myers is a 37 y.o. male, who is here today to establish care.  Former PCP: N/A Last preventive routine visit: ? 2016.  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   Concerns today: Fatigue, "butt itching", and knee pain.  According to patient, his wife asked him to arrange this appointment because she is concerned about fatigue. He states that he "always" has had fatigue. He has not had blood work done due to this problem. He sleeps from 5 to 7 hours, does not feel rested when he first wake up. A couple days ago he slept for 8 hours and felt better. No known sleep apnea or loud snoring.  He denies history of anxiety, states that he feels mildly depressed and easily irritated. He has not try OTC medication.  He has not been consistent with a healthy diet or regular physical activity.  No fever, chills, night sweats, abnormal weight loss, easy bruising, or adenopathies. He lives with his wife and 3 children (81 years old, 51 years old, and 8 months old).  He is requesting blood work done, his wife sent a list of labs she would like for him to have done, including cholesterol and testosterone. Denies decreased libido or ED.  Perianal pruritus for the past 6 months. He has not used OTC medications. Pruritus is intense, sometimes he causes bleeding from scratching. He denies constipation or diarrhea. On PMHx IBS is listed.  Problem has been stable.  Right knee pain, medial aspect, for many years, initially was intermittently and now is more frequent. No history of trauma, no effusion or erythema. No limitation of movement. It is exacerbated by certain activities but is not limiting his daily activities. He has not try OTC medication.   Review of Systems  Constitutional: Positive for fatigue. Negative for activity change, appetite change and fever.  HENT: Negative for mouth sores, nosebleeds, sore throat and  trouble swallowing.   Eyes: Negative for redness and visual disturbance.  Respiratory: Negative for apnea, cough, shortness of breath and wheezing.   Cardiovascular: Negative for chest pain, palpitations and leg swelling.  Gastrointestinal: Negative for abdominal pain, blood in stool, nausea and vomiting.       No changes in bowel habits.  Endocrine: Negative for cold intolerance, heat intolerance, polydipsia, polyphagia and polyuria.  Genitourinary: Negative for decreased urine volume, dysuria and hematuria.  Musculoskeletal: Negative for back pain, gait problem and myalgias.  Neurological: Negative for syncope, weakness, numbness and headaches.  Hematological: Negative for adenopathy. Does not bruise/bleed easily.  Psychiatric/Behavioral: Negative for confusion and suicidal ideas. The patient is nervous/anxious.     No current outpatient medications on file prior to visit.   No current facility-administered medications on file prior to visit.      Past Medical History:  Diagnosis Date  . History of chicken pox   . IBS (irritable bowel syndrome)    Allergies  Allergen Reactions  . Penicillins Hives    Family History  Problem Relation Age of Onset  . Heart disease Maternal Grandfather   . Diabetes Paternal Grandmother   . Heart disease Paternal Grandmother     Social History   Socioeconomic History  . Marital status: Married    Spouse name: Not on file  . Number of children: Not on file  . Years of education: Not on file  . Highest education level: Not on file  Occupational History  . Not on file  Social Needs  . Financial resource strain: Not on file  . Food insecurity:    Worry: Not on file    Inability: Not on file  . Transportation needs:    Medical: Not on file    Non-medical: Not on file  Tobacco Use  . Smoking status: Former Smoker    Types: Cigarettes    Last attempt to quit: 08/16/2009    Years since quitting: 8.0  . Smokeless tobacco: Former Research scientist (medical) and Sexual Activity  . Alcohol use: Yes  . Drug use: No  . Sexual activity: Not on file  Lifestyle  . Physical activity:    Days per week: Not on file    Minutes per session: Not on file  . Stress: Not on file  Relationships  . Social connections:    Talks on phone: Not on file    Gets together: Not on file    Attends religious service: Not on file    Active member of club or organization: Not on file    Attends meetings of clubs or organizations: Not on file    Relationship status: Not on file  Other Topics Concern  . Not on file  Social History Narrative  . Not on file    Vitals:   08/14/17 1358  BP: 125/78  Pulse: 70  Resp: 12  Temp: 98.3 F (36.8 C)  SpO2: 98%    Body mass index is 27.96 kg/m.   Physical Exam  Nursing note and vitals reviewed. Constitutional: He is oriented to person, place, and time. He appears well-developed. He does not appear ill. No distress.  HENT:  Head: Normocephalic and atraumatic.  Mouth/Throat: Oropharynx is clear and moist and mucous membranes are normal.  Eyes: Pupils are equal, round, and reactive to light. Conjunctivae are normal.  Neck: No muscular tenderness present. No tracheal deviation present. No thyroid mass and no thyromegaly present.  Cardiovascular: Normal rate and regular rhythm.  No murmur heard. Pulses:      Dorsalis pedis pulses are 2+ on the right side, and 2+ on the left side.  Respiratory: Effort normal and breath sounds normal. No respiratory distress.  GI: Soft. He exhibits no mass. There is no tenderness.  Genitourinary:  Genitourinary Comments: Mild perianal erythema.  No ulcers or other skin lesion appreciated.  Musculoskeletal: He exhibits no edema or tenderness.       Right knee: He exhibits normal range of motion, no effusion, no deformity, no erythema and normal alignment. No tenderness found.  Mild quadriceps atrophy right lower extremity. Minimal crepitus with flexion.    Lymphadenopathy:    He has no cervical adenopathy.       Right: No supraclavicular adenopathy present.       Left: No supraclavicular adenopathy present.  Neurological: He is alert and oriented to person, place, and time. He has normal strength. No cranial nerve deficit. Gait normal.  Skin: Skin is warm. No rash noted. No erythema.  Psychiatric: He has a normal mood and affect.  Well-groomed, good eye contact.    ASSESSMENT AND PLAN:   Mr. Javid was seen today for establish care.  Orders Placed This Encounter  Procedures  . Comprehensive metabolic panel  . TSH  . Testosterone  . VITAMIN D 25 Hydroxy (Vit-D Deficiency, Fractures)  . CBC  . Lipid panel     Chronic fatigue We discussed possible etiologies: Systemic illness, immunologic,endocrinology,sleep disorder, psychiatric/psychologic, infectious,medications side effects, and idiopathic. Examination today does not suggest  a serious process. Healthy diet and regular physical activity may help.  Further recommendations will be given according to lab results.   Perianal dermatitis Topical rectal hydrocortisone 2.5% cream, small amount daily as needed recommended.  We discussed some side effects of chronic topical steroid use. Adequate hygiene. Avoid toilet paper, instead use wipes. Desitin daily as needed may also help. Follow-up as needed.   Chronic pain of right knee We discussed possible etiologies. He is not interested in PT for now. Follow-up as needed.   Screening for lipid disorders -     Lipid panel; Future    Follow-up to be arranged depending of lab results. He is not fasting today and some of the labs are preferable in the morning, so he will come back next week between 8 and 10 AM fasting.       Christopherjame Carnell G. [place], MD  [facility]. [place] office.

## 2017-08-14 NOTE — Assessment & Plan Note (Signed)
Topical rectal hydrocortisone 2.5% cream, small amount daily as needed recommended.  We discussed some side effects of chronic topical steroid use. Adequate hygiene. Avoid toilet paper, instead use wipes. Desitin daily as needed may also help. Follow-up as needed.

## 2017-08-14 NOTE — Assessment & Plan Note (Addendum)
We discussed possible etiologies: Systemic illness, immunologic,endocrinology,sleep disorder, psychiatric/psychologic, infectious,medications side effects, and idiopathic. Examination today does not suggest a serious process.  He is not interested in a trial of anxiolytic/antidepressant.  Healthy diet and regular physical activity may help.  Further recommendations will be given according to lab results.

## 2017-08-14 NOTE — Assessment & Plan Note (Signed)
We discussed possible etiologies. He is not interested in PT for now. Follow-up as needed.

## 2017-09-01 MED FILL — PROCTOZONE-HC 2.5 % CREA: 2.5 | 15 days supply | Qty: 30 | Fill #0

## 2018-01-30 DIAGNOSIS — Z23 Encounter for immunization: Secondary | ICD-10-CM | POA: Diagnosis not present

## 2018-03-16 ENCOUNTER — Ambulatory Visit (INDEPENDENT_AMBULATORY_CARE_PROVIDER_SITE_OTHER): Payer: 59 | Admitting: Family Medicine

## 2018-03-16 ENCOUNTER — Encounter: Payer: Self-pay | Admitting: Family Medicine

## 2018-03-16 VITALS — BP 120/80 | HR 74 | Temp 98.2°F | Ht 71.0 in | Wt 195.4 lb

## 2018-03-16 DIAGNOSIS — G4726 Circadian rhythm sleep disorder, shift work type: Secondary | ICD-10-CM

## 2018-03-16 DIAGNOSIS — M25561 Pain in right knee: Secondary | ICD-10-CM | POA: Diagnosis not present

## 2018-03-16 DIAGNOSIS — Z0000 Encounter for general adult medical examination without abnormal findings: Secondary | ICD-10-CM

## 2018-03-16 DIAGNOSIS — H6063 Unspecified chronic otitis externa, bilateral: Secondary | ICD-10-CM

## 2018-03-16 DIAGNOSIS — Z13220 Encounter for screening for lipoid disorders: Secondary | ICD-10-CM

## 2018-03-16 DIAGNOSIS — R5383 Other fatigue: Secondary | ICD-10-CM | POA: Diagnosis not present

## 2018-03-16 DIAGNOSIS — Z0001 Encounter for general adult medical examination with abnormal findings: Secondary | ICD-10-CM

## 2018-03-16 NOTE — Patient Instructions (Signed)

## 2018-03-16 NOTE — Progress Notes (Signed)
Patient: Zachary Myers, Male    DOB: 1980-10-22, 37 y.o.   MRN: [id number] Visit Date: 03/17/2018  Today's Provider: Danelle Berry, PA-C   Chief Complaint  Patient presents with  . Annual Exam    Patient has c/o right knee pain, and feeling tired a lot   Subjective:    Annual physical exam Zachary Myers is a 37 y.o. male who presents today for health maintenance and complete physical. He feels fairly well. He reports exercising not at all right now. 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  On his days off he is able to get better sleep.  ----------------------------------------------------------------- Fatigue works third shift and has 3 small children at home, doesn't get much good sleep Wife is concerned for fatigue and some moods an ddepressed occasionally Pt endorses moods occasionally for a day or two    No weight, hair or skin changes   Has lost 20 lbs since July with shift change - doesn't have time to eat or skips eating  Libido is fine  Nights 4-11's, 4 pm to 3 am - sleep for 2 hours, then take kids to school, then back to sleep from 9am -1pm  and then 4 off -   Number cigarette smoker from age [age], 48 year smoker, quit 8-9 years ago   Right knee - used to hurt with extreme flexion began hurting about 5 years ago, in the past with her occasionally with sitting criss cross apple sauce, the past several months he has more continuous and more severe knee pain that is felt to the medial right knee, no trauma, no twisting injury, no other aggravating or alleviating factors.  Dad history of cholesterol Maternal grandfather cholesterol and heart disease  Review of Systems  Constitutional: Negative.  Negative for activity change, appetite change,  fatigue and unexpected weight change.  HENT: Negative.   Eyes: Negative.   Respiratory: Negative.  Negative for shortness of breath.   Cardiovascular: Negative.  Negative for chest pain, palpitations and leg swelling.  Gastrointestinal: Negative.  Negative for abdominal pain and blood in stool.  Endocrine: Negative.   Genitourinary: Negative.  Negative for decreased urine volume, difficulty urinating, testicular pain and urgency.  Skin: Negative.  Negative for color change and pallor.  Allergic/Immunologic: Negative.   Neurological: Negative.  Negative for syncope, weakness, light-headedness and numbness.  Psychiatric/Behavioral: Negative.  Negative for confusion, dysphoric mood, self-injury and suicidal ideas. The patient is not nervous/anxious.   All other systems reviewed and are negative.   Social History      He  reports that he quit smoking about 8 years ago. His smoking use included cigarettes. He quit after 15.00 years of use. He has quit using smokeless tobacco. He reports current alcohol use of about 3.0 standard drinks of alcohol per week. He reports that he does not use drugs.       Social History   Socioeconomic History  . Marital status: Married    Spouse name: Not on file  . Number of children: Not on file  . Years of education: Not on file  . Highest education level: Not on file  Occupational History  . Not on file  Social Needs  .  Financial resource strain: Not on file  . Food insecurity:    Worry: Not on file    Inability: Not on file  . Transportation needs:    Medical: Not on file    Non-medical: Not on file  Tobacco Use  . Smoking status: Former Smoker    Years: 15.00    Types: Cigarettes    Last attempt to quit: 08/16/2009    Years since quitting: 8.5  . Smokeless tobacco: Former Engineer, water and Sexual Activity  . Alcohol use: Yes    Alcohol/week: 3.0 standard drinks    Types: 3 Cans of beer per week  . Drug use: No  . Sexual activity: Yes    Lifestyle  . Physical activity:    Days per week: Not on file    Minutes per session: Not on file  . Stress: Not on file  Relationships  . Social connections:    Talks on phone: Not on file    Gets together: Not on file    Attends religious service: Not on file    Active member of club or organization: Not on file    Attends meetings of clubs or organizations: Not on file    Relationship status: Not on file  Other Topics Concern  . Not on file  Social History Narrative  . Not on file    Past Medical History:  Diagnosis Date  . History of chicken pox   . IBS (irritable bowel syndrome)      Patient Active Problem List   Diagnosis Date Noted  . Chronic fatigue 08/14/2017  . Perianal dermatitis 08/14/2017  . Chronic pain of right knee 08/14/2017  . IBS (irritable bowel syndrome)     History reviewed. No pertinent surgical history.  Family History        Family Status  Relation Name Status  . Mother  Alive  . Father  Alive  . Sister none Alive  . Brother none Alive  . Daughter  Alive  . Son  Alive  . MGM  Deceased  . MGF  Deceased  . PGM  Alive  . PGF  Deceased        His family history includes Diabetes in his paternal grandmother; Heart disease in his maternal grandfather and paternal grandmother.      Allergies  Allergen Reactions  . Penicillins Hives     Current Outpatient Medications:  .  hydrocortisone (ANUSOL-HC) 2.5 % rectal cream, Place 1 application rectally daily as needed for hemorrhoids or anal itching. (Patient not taking: Reported on 03/16/2018), Disp: 30 g, Rfl: 1   Patient Care Team: Danelle Berry, PA-C as PCP - General (Family Medicine)      Objective:   Vitals: BP 120/80   Pulse 74   Temp 98.2 F (36.8 C) (Oral)   Ht 5\' 11"  (1.803 m)   Wt 195 lb 6 oz (88.6 kg)   SpO2 97%   BMI 27.25 kg/m    Vitals:   03/16/18 0929  BP: 120/80  Pulse: 74  Temp: 98.2 F (36.8 C)  TempSrc: Oral  SpO2: 97%  Weight: 195 lb 6 oz (88.6 kg)   Height: 5\' 11"  (1.803 m)     Physical Exam Constitutional:      General: He is not in acute distress.    Appearance: Normal appearance. He is well-developed. He is not ill-appearing, toxic-appearing or diaphoretic.  HENT:     Head: Normocephalic and atraumatic.     Jaw: No  trismus.     Right Ear: Tympanic membrane, ear canal and external ear normal.     Left Ear: Tympanic membrane, ear canal and external ear normal.     Nose: Nose normal. No mucosal edema, congestion or rhinorrhea.     Right Sinus: No maxillary sinus tenderness or frontal sinus tenderness.     Left Sinus: No maxillary sinus tenderness or frontal sinus tenderness.     Mouth/Throat:     Mouth: Mucous membranes are moist.     Pharynx: Uvula midline. No oropharyngeal exudate, posterior oropharyngeal erythema or uvula swelling.  Eyes:     General: Lids are normal. No scleral icterus.    Conjunctiva/sclera: Conjunctivae normal.     Pupils: Pupils are equal, round, and reactive to light.  Neck:     Musculoskeletal: Normal range of motion and neck supple.     Trachea: Trachea and phonation normal. No tracheal deviation.  Cardiovascular:     Rate and Rhythm: Normal rate and regular rhythm.     Pulses: Normal pulses.          Radial pulses are 2+ on the right side and 2+ on the left side.       Posterior tibial pulses are 2+ on the right side and 2+ on the left side.     Heart sounds: Normal heart sounds. No murmur. No friction rub. No gallop.   Pulmonary:     Effort: Pulmonary effort is normal. No respiratory distress.     Breath sounds: Normal breath sounds. No wheezing, rhonchi or rales.  Chest:     Chest wall: No tenderness.  Abdominal:     General: Bowel sounds are normal. There is no distension.     Palpations: Abdomen is soft. There is no mass.     Tenderness: There is no abdominal tenderness. There is no guarding or rebound.     Hernia: No hernia is present.  Musculoskeletal: Normal range of motion.      Right hip: Normal.     Right knee: He exhibits abnormal meniscus. He exhibits normal range of motion, no swelling, no effusion, no ecchymosis, no deformity, no erythema, normal alignment, no LCL laxity, normal patellar mobility and no MCL laxity. Tenderness found. Medial joint line and MCL tenderness noted. No lateral joint line, no LCL and no patellar tendon tenderness noted.     Right ankle: Normal.  Lymphadenopathy:     Cervical: No cervical adenopathy.  Skin:    General: Skin is warm and dry.     Capillary Refill: Capillary refill takes less than 2 seconds.     Coloration: Skin is not pale.     Findings: No rash.     Nails: There is no clubbing.   Neurological:     Mental Status: He is alert and oriented to person, place, and time.     Gait: Gait normal.  Psychiatric:        Attention and Perception: Attention normal.        Mood and Affect: Mood and affect normal. Mood is not anxious or depressed. Affect is not flat or inappropriate.        Speech: Speech normal.        Behavior: Behavior normal. Behavior is cooperative.        Thought Content: Thought content normal. Thought content is not paranoid or delusional. Thought content does not include homicidal or suicidal ideation. Thought content does not include suicidal plan.  Cognition and Memory: Cognition and memory normal.        Judgment: Judgment normal.      Depression Screen PHQ 2/9 Scores 03/16/2018 11/20/2016  PHQ - 2 Score 2 0  PHQ- 9 Score 7 0     [facility]/16/2018 [facility]  AUDIT-C Score  1         Assessment & Plan:     Routine Health Maintenance and Physical Exam  Exercise Activities and Dietary recommendations  -Discussed healthy diet and recommendations of exercise AHA Discussed health benefits of physical activity, and encouraged him to engage in regular exercise appropriate for his age and condition.    There is no immunization history on file for this  patient.  Health Maintenance  Topic Date Due  . TETANUS/TDAP  09/27/1997  . INFLUENZA VACCINE  10/29/2017  . HIV Screening  Completed     Tdap updated with stitches at urgent care Flu done at [facility] this year  In addition to doing his complete physical patient does endorse a few acute problems and addresses some old problems.  Does have some decreased energy and slightly worsening mood.  He does have a positive PHQ 9 but the positive questions are related to sleep and energy which I believe are secondary to third shift and him not getting enough sleep.  He does have some generalized fatigue and his wife is concerned about some deficiencies he is already supplementing with B12 vitamins which have helped him with his mood and energy a little bit.  I will not screen that.  Will screen vitamin D because of his daytime sleeping and nighttime working this may be a factor.  Will also screen testosterone and thyroid hormones, screen for anemia and other routine screening for diabetes and hyperlipidemia.  Otherwise appears very healthy, BMI is just mildly elevated.  Sent in rx for Cortisporin drops for chronic ear irritation.  Does describe some thickening of skin and itching chronically to his bilateral ears and red rash to his bilateral cheeks near the nasolabial fold he may have some mild chronic skin condition, but he does state that when he has the eardrops his skin on his cheeks improve.  So will wait for referral or any other attempted treatments at this time to see if he has resolution with the eardrops.  Problem List Items Addressed This Visit    None    Visit Diagnoses    General medical exam    -  Primary   Relevant Orders   CBC with Differential/Platelet (Completed)   COMPLETE METABOLIC PANEL WITH GFR (Completed)   Lipid panel (Completed)   Fatigue, unspecified type       Screen for anemia, vitamin deficiencies, thyroid and testosterone   Relevant Orders   CBC with  Differential/Platelet (Completed)   TSH (Completed)   Testosterone (Completed)   Vitamin B12 (Completed)   VITAMIN D 25 Hydroxy (Vit-D Deficiency, Fractures) (Completed)   Right knee pain, unspecified chronicity       X-ray to evaluate joint space, encouraged referral to Ortho since pain has been chronic and worsening for 5 years   Relevant Orders   DG Knee Complete 4 Views Right   Screening for hyperlipidemia       Fasting labs   Relevant Orders   CBC with Differential/Platelet (Completed)   COMPLETE METABOLIC PANEL WITH GFR (Completed)   Lipid panel (Completed)   Chronic otitis externa of both ears, unspecified type  Treat with eardrops as needed.  Prescription sent to pharmacy   Relevant Medications   neomycin-polymyxin-hydrocortisone (CORTISPORIN) OTIC solution   Circadian rhythm sleep disorder, shift work type       needs more contiguous sleep after night shift     Patient also mentions right knee pain that is been ongoing for 5 years without injury, has become more severe and constant over the last several months.  Does have some mild tenderness to the MCL and some pain with meniscal testing, would like to obtain x-ray to evaluate joint space and discussed with him that he may want to see an orthopedist sooner than later to get a specialist evaluation and treatment options.  Differential does include some degenerative joint disease/osteoarthritis of the right knee and possibly medial meniscal and MCL injury.   Danelle Berry, PA-C 03/17/18 6:03 PM  [facility]

## 2018-03-17 ENCOUNTER — Encounter: Payer: Self-pay | Admitting: Family Medicine

## 2018-03-17 LAB — COMPLETE METABOLIC PANEL WITH GFR
AG Ratio: 2 (calc) (ref 1.0–2.5)
ALT: 11 U/L (ref 9–46)
AST: 15 U/L (ref 10–40)
Albumin: 4.5 g/dL (ref 3.6–5.1)
Alkaline phosphatase (APISO): 71 U/L (ref 40–115)
BUN: 11 mg/dL (ref 7–25)
CO2: 27 mmol/L (ref 20–32)
Calcium: 9.9 mg/dL (ref 8.6–10.3)
Chloride: 103 mmol/L (ref 98–110)
Creat: 0.9 mg/dL (ref 0.60–1.35)
GFR, Est African American: 124 mL/min/{1.73_m2} (ref >=60)
GFR, Est Non African American: 107 mL/min/{1.73_m2} (ref >=60)
Globulin: 2.2 g/dL (calc) (ref 1.9–3.7)
Glucose, Bld: 88 mg/dL (ref 65–99)
Potassium: 4.7 mmol/L (ref 3.5–5.3)
Sodium: 140 mmol/L (ref 135–146)
Total Bilirubin: 0.6 mg/dL (ref 0.2–1.2)
Total Protein: 6.7 g/dL (ref 6.1–8.1)

## 2018-03-17 LAB — CBC WITH DIFFERENTIAL/PLATELET
Absolute Monocytes: 396 cells/uL (ref 200–950)
Basophils Absolute: 22 cells/uL (ref 0–200)
Basophils Relative: 0.5 %
Eosinophils Absolute: 40 cells/uL (ref 15–500)
Eosinophils Relative: 0.9 %
HCT: 44.6 % (ref 38.5–50.0)
Hemoglobin: 14.8 g/dL (ref 13.2–17.1)
Lymphs Abs: 1650 cells/uL (ref 850–3900)
MCH: 29.1 pg (ref 27.0–33.0)
MCHC: 33.2 g/dL (ref 32.0–36.0)
MCV: 87.6 fL (ref 80.0–100.0)
MPV: 10.5 fL (ref 7.5–12.5)
Monocytes Relative: 9 %
Neutro Abs: 2292 cells/uL (ref 1500–7800)
Neutrophils Relative %: 52.1 %
Platelets: 165 10*3/uL (ref 140–400)
RBC: 5.09 10*6/uL (ref 4.20–5.80)
RDW: 12.3 % (ref 11.0–15.0)
Total Lymphocyte: 37.5 %
WBC: 4.4 10*3/uL (ref 3.8–10.8)

## 2018-03-17 LAB — LIPID PANEL
Cholesterol: 177 mg/dL (ref <200)
HDL: 47 mg/dL (ref >40)
LDL Cholesterol (Calc): 114 mg/dL (calc) — ABNORMAL HIGH
Non-HDL Cholesterol (Calc): 130 mg/dL (calc) — ABNORMAL HIGH (ref <130)
Total CHOL/HDL Ratio: 3.8 (calc) (ref <5.0)
Triglycerides: 67 mg/dL (ref <150)

## 2018-03-17 LAB — VITAMIN B12: Vitamin B-12: 410 pg/mL (ref 200–1100)

## 2018-03-17 LAB — TESTOSTERONE: Testosterone: 619 ng/dL (ref 250–827)

## 2018-03-17 LAB — TSH: TSH: 1.26 mIU/L (ref 0.40–4.50)

## 2018-03-17 LAB — VITAMIN D 25 HYDROXY (VIT D DEFICIENCY, FRACTURES): Vit D, 25-Hydroxy: 28 ng/mL — ABNORMAL LOW (ref 30–100)

## 2018-03-17 MED ORDER — NEOMYCIN-POLYMYXIN-HC 3.5-10000-1 OT SOLN: 4.0000 [drp] | Freq: Four times a day (QID) | OTIC | 0 refills | Status: AC

## 2018-03-18 MED FILL — NEO/POLYMYXIN/HC EAR SOLN: 3.5-10000-1 | 7 days supply | Qty: 10 | Fill #0

## 2018-03-19 ENCOUNTER — Ambulatory Visit
Admission: RE | Admit: 2018-03-19 | Discharge: 2018-03-19 | Disposition: A | Discharge status: Home / Self Care | Payer: 59 | Source: Ambulatory Visit | Attending: Family Medicine | Admitting: Family Medicine

## 2018-03-19 DIAGNOSIS — M25561 Pain in right knee: Secondary | ICD-10-CM | POA: Diagnosis not present

## 2018-03-22 NOTE — Addendum Note (Signed)
Addended by: Danelle BerryAPIA, Aiyanna Awtrey on: 03/22/2018 07:42 AM   Modules accepted: Orders

## 2018-04-26 DIAGNOSIS — S83241A Other tear of medial meniscus, current injury, right knee, initial encounter: Secondary | ICD-10-CM | POA: Diagnosis not present

## 2018-05-21 DIAGNOSIS — M25561 Pain in right knee: Secondary | ICD-10-CM | POA: Diagnosis not present

## 2019-02-08 ENCOUNTER — Telehealth: Payer: 59 | Admitting: Physician Assistant

## 2019-02-08 DIAGNOSIS — J029 Acute pharyngitis, unspecified: Secondary | ICD-10-CM

## 2019-02-08 NOTE — Progress Notes (Signed)
We are sorry that you are not feeling well.  Here is how we plan to help!  Your symptoms indicate a likely viral infection (Pharyngitis).  Given your recent exposure to your daughter who had hand foot and mouth disease, it is very likely you contracted it as well.  This is a contagious virus, so please practice good handwashing and maintain a safe distance from others.   You should expect your symptoms to improve in 7-10 days, your throat lesions should improve in about 5-6 days.  If your symptoms worsen, please see a medical provider face-to-face.   Pharyngitis is inflammation in the back of the throat which can cause a sore throat, scratchiness and sometimes difficulty swallowing.   Pharyngitis is typically caused by a respiratory virus and will just run its course.  Please keep in mind that your symptoms could last up to 10 days.    For sore throat - Cepacol throat lozenges. - Gargle with 8 oz of salt water ( tsp of salt per 1 qt of water) as often as every 1-2 hours to soothe your throat. - Gargle liquid Benadryl   For throat pain, we recommend over the counter oral pain relief medications such as acetaminophen or aspirin, or anti-inflammatory medications such as ibuprofen or naproxen sodium.  Topical treatments such as oral throat lozenges or sprays may be used as needed.  Avoid close contact with loved ones, especially the very young and elderly.  Remember to wash your hands thoroughly throughout the day as this is the number one way to prevent the spread of infection and wipe down door knobs and counters with disinfectant.  After careful review of your answers, I would not recommend and antibiotic for your condition.  Antibiotics should not be used to treat conditions that we suspect are caused by viruses like the virus that causes the common cold or flu. However, some people can have Strep with atypical symptoms. You may need formal testing in clinic or office to confirm if your symptoms  continue or worsen.  Providers prescribe antibiotics to treat infections caused by bacteria. Antibiotics are very powerful in treating bacterial infections when they are used properly.  To maintain their effectiveness, they should be used only when necessary.  Overuse of antibiotics has resulted in the development of super bugs that are resistant to treatment!    Home Care:  Only take medications as instructed by your medical team.  Do not drink alcohol while taking these medications.  A steam or ultrasonic humidifier can help congestion.  You can place a towel over your head and breathe in the steam from hot water coming from a faucet.  Avoid close contacts especially the very young and the elderly.  Cover your mouth when you cough or sneeze.  Always remember to wash your hands.  Get Help Right Away If:  You develop worsening fever or throat pain.  You develop a severe head ache or visual changes.  Your symptoms persist after you have completed your treatment plan.  Make sure you  Understand these instructions.  Will watch your condition.  Will get help right away if you are not doing well or get worse.  Your e-visit answers were reviewed by a board certified advanced clinical practitioner to complete your personal care plan.  Depending on the condition, your plan could have included both over the counter or prescription medications.  If there is a problem please reply  once you have received a response from your provider.  Your safety is important to [place].  If you have drug allergies check your prescription carefully.    You can use MyChart to ask questions about todays visit, request a non-urgent call back, or ask for a work or school excuse for 24 hours related to this e-Visit. If it has been greater than 24 hours you will need to follow up with your provider, or enter a new e-Visit to address those concerns.  You will get an e-mail in the next two days asking about your  experience.  I hope that your e-visit has been valuable and will speed your recovery. Thank you for using e-visits.   Greater than 5 minutes, yet less than 10 minutes of time have been spent researching, coordinating and implementing care for this patient today.

## 2019-10-12 ENCOUNTER — Other Ambulatory Visit: Payer: Self-pay

## 2019-10-12 ENCOUNTER — Telehealth: Payer: Self-pay | Admitting: Nurse Practitioner

## 2019-10-12 ENCOUNTER — Ambulatory Visit (INDEPENDENT_AMBULATORY_CARE_PROVIDER_SITE_OTHER): Payer: 59 | Admitting: Nurse Practitioner

## 2019-10-12 ENCOUNTER — Encounter: Payer: Self-pay | Admitting: Nurse Practitioner

## 2019-10-12 VITALS — BP 112/70 | HR 67 | Temp 97.9°F | Resp 18 | Ht 70.0 in | Wt 200.2 lb

## 2019-10-12 DIAGNOSIS — Z13228 Encounter for screening for other metabolic disorders: Secondary | ICD-10-CM

## 2019-10-12 DIAGNOSIS — Z0001 Encounter for general adult medical examination with abnormal findings: Secondary | ICD-10-CM

## 2019-10-12 DIAGNOSIS — H60543 Acute eczematoid otitis externa, bilateral: Secondary | ICD-10-CM

## 2019-10-12 DIAGNOSIS — Z13220 Encounter for screening for lipoid disorders: Secondary | ICD-10-CM | POA: Diagnosis not present

## 2019-10-12 DIAGNOSIS — Z1159 Encounter for screening for other viral diseases: Secondary | ICD-10-CM

## 2019-10-12 DIAGNOSIS — Z0000 Encounter for general adult medical examination without abnormal findings: Secondary | ICD-10-CM

## 2019-10-12 DIAGNOSIS — E559 Vitamin D deficiency, unspecified: Secondary | ICD-10-CM | POA: Diagnosis not present

## 2019-10-12 NOTE — Progress Notes (Signed)
Established Patient Office Visit  Subjective:  Patient ID: Zachary Myers, male    DOB: [birth year]  Age: 39 y.o. MRN: [id number]  CC:  Chief Complaint  Patient presents with  . Annual Exam    HPI Zachary Myers is a 39 year old male presenting for annual wellness examination. He denied cp, ct, gu/gi sxs, edema, sob, pain, or falls. Hs has right great toenail with fungus that he has used without non compliance over the counter remedy. Treatment options discussed and he would like to treat following directions the otc plan and if not resolved will call clinic for oral treatment. Discussed if oral tx does not resolve podiatry for nail removal would be third option.   Labs to be drawn today for screening.   Time spent discussing COVID vaccine, pt compelled and considering. Also blood pressure goals, disease prevention screenings per age, diet and exercise goals discussed, treatment plan.   The pt reports chronic bilateral knee discomfort that he does not like non medication remedies. He reports having images and was not informed of findings from orthopedics only nothing needed to be done. He would like PCP to review. He will assure records are transferred. In meantime bilateral knee brace, ice, heat discussed as non medication remedies to chronic knee pain. Also discuss topical Voltaren as non oral medication treatment that pt is willing to consider. Diet for antiinflammatory such as tart cherry juice discussed pt reported will research antiinflammatory diet.  We did discuss that without workup images it is not know if inflammatory or wear and tear.   Pt expresses desire to live ling healthy life with preventative measures. Time spent discussing disease prevention and important of timely PCP annual and follow up visits.   Pt uses a medication for bilateral ear eczema he would like refilled. Will have added to list of medications and refill for pt today.   Past Medical History:    Diagnosis Date  . History of chicken pox   . IBS (irritable bowel syndrome)     No past surgical history on file.  Family History  Problem Relation Age of Onset  . Heart disease Maternal Grandfather   . Diabetes Paternal Grandmother   . Heart disease Paternal Grandmother     Social History   Socioeconomic History  . Marital status: Married    Spouse name: Not on file  . Number of children: Not on file  . Years of education: Not on file  . Highest education level: Not on file  Occupational History  . Not on file  Tobacco Use  . Smoking status: Former Smoker    Years: 15.00    Types: Cigarettes    Quit date: 08/16/2009    Years since quitting: 10.1  . Smokeless tobacco: Former Clinical biochemist  . Vaping Use: Never used  Substance and Sexual Activity  . Alcohol use: Yes    Alcohol/week: 3.0 standard drinks    Types: 3 Cans of beer per week  . Drug use: No  . Sexual activity: Yes  Other Topics Concern  . Not on file  Social History Narrative  . Not on file   Social Determinants of Health   Financial Resource Strain:   . Difficulty of Paying Living Expenses:   Food Insecurity:   . Worried About Programme researcher, broadcasting/film/video in the Last Year:   . Barista in the Last Year:   Transportation Needs:   . Lack of Transportation (  Medical):   Marland Kitchen Lack of Transportation (Non-Medical):   Physical Activity:   . Days of Exercise per Week:   . Minutes of Exercise per Session:   Stress:   . Feeling of Stress :   Social Connections:   . Frequency of Communication with Friends and Family:   . Frequency of Social Gatherings with Friends and Family:   . Attends Religious Services:   . Active Member of Clubs or Organizations:   . Attends Banker Meetings:   Marland Kitchen Marital Status:   Intimate Partner Violence:   . Fear of Current or Ex-Partner:   . Emotionally Abused:   Marland Kitchen Physically Abused:   . Sexually Abused:     Outpatient Medications Prior to Visit  Medication  Sig Dispense Refill  . mometasone (ELOCON) 0.1 % lotion Apply topically daily.    . hydrocortisone (ANUSOL-HC) 2.5 % rectal cream Place 1 application rectally daily as needed for hemorrhoids or anal itching. (Patient not taking: Reported on 03/16/2018) 30 g 1   No facility-administered medications prior to visit.    Allergies  Allergen Reactions  . Penicillins Hives    ROS Review of Systems  All other systems reviewed and are negative.     Objective:    Physical Exam Vitals and nursing note reviewed.  Constitutional:      Appearance: Normal appearance. He is well-developed, well-groomed and normal weight. He is not ill-appearing, toxic-appearing or diaphoretic.  HENT:     Head: Normocephalic.     Right Ear: Hearing, ear canal and external ear normal.     Left Ear: Hearing, ear canal and external ear normal.     Nose: Nose normal. No congestion or rhinorrhea.     Mouth/Throat:     Lips: Pink.     Mouth: Mucous membranes are moist.     Dentition: Normal dentition. No dental tenderness.     Tongue: No lesions. Tongue does not deviate from midline.     Pharynx: Oropharynx is clear. Uvula midline. No oropharyngeal exudate or posterior oropharyngeal erythema.  Eyes:     General: Lids are normal. Lids are everted, no foreign bodies appreciated. No scleral icterus.    Extraocular Movements: Extraocular movements intact.     Conjunctiva/sclera: Conjunctivae normal.     Pupils: Pupils are equal, round, and reactive to light.  Neck:     Thyroid: No thyromegaly or thyroid tenderness.     Vascular: No carotid bruit or JVD.  Cardiovascular:     Rate and Rhythm: Normal rate and regular rhythm.     Pulses: Normal pulses.     Heart sounds: Normal heart sounds, S1 normal and S2 normal.  Pulmonary:     Effort: Pulmonary effort is normal.     Breath sounds: Normal breath sounds.  Chest:     Chest wall: No deformity or swelling.  Abdominal:     General: Abdomen is flat. Bowel sounds  are normal. There is no abdominal bruit.     Palpations: Abdomen is soft. There is no hepatomegaly or splenomegaly.     Tenderness: There is no abdominal tenderness. There is no right CVA tenderness, left CVA tenderness, guarding or rebound.  Musculoskeletal:        General: Normal range of motion.     Right shoulder: Normal.     Left shoulder: Normal.     Cervical back: Normal, normal range of motion and neck supple.     Thoracic back: Normal.     Lumbar  back: Normal.     Right lower leg: Normal. No edema.     Left lower leg: Normal. No edema.  Lymphadenopathy:     Head:     Right side of head: No submental, submandibular or tonsillar adenopathy.     Left side of head: No submental, submandibular or tonsillar adenopathy.     Cervical: No cervical adenopathy.  Skin:    General: Skin is warm and dry.     Capillary Refill: Capillary refill takes less than 2 seconds.     Coloration: Skin is not jaundiced.     Findings: No bruising.  Neurological:     General: No focal deficit present.     Mental Status: He is alert and oriented to person, place, and time. Mental status is at baseline.     GCS: GCS eye subscore is 4. GCS verbal subscore is 5. GCS motor subscore is 6.     Cranial Nerves: No cranial nerve deficit.     Gait: Gait normal.  Psychiatric:        Attention and Perception: Attention and perception normal.        Mood and Affect: Mood and affect normal.        Speech: Speech normal.        Behavior: Behavior normal. Behavior is cooperative.        Thought Content: Thought content normal.        Cognition and Memory: Cognition and memory normal.        Judgment: Judgment normal.     BP 112/70 (BP Location: Left Arm, Patient Position: Sitting, Cuff Size: Normal)   Pulse 67   Temp 97.9 F (36.6 C) (Temporal)   Resp 18   Ht 5\' 10"  (1.778 m)   Wt 200 lb 3.2 oz (90.8 kg)   SpO2 98%   BMI 28.73 kg/m  Wt Readings from Last 3 Encounters:  10/12/19 200 lb 3.2 oz (90.8 kg)    03/16/18 195 lb 6 oz (88.6 kg)  08/14/17 200 lb 8 oz (90.9 kg)     There are no preventive care reminders to display for this patient.  There are no preventive care reminders to display for this patient.  Lab Results  Component Value Date   TSH 1.26 03/16/2018   Lab Results  Component Value Date   WBC 4.4 03/16/2018   HGB 14.8 03/16/2018   HCT 44.6 03/16/2018   MCV 87.6 03/16/2018   PLT 165 03/16/2018   Lab Results  Component Value Date   NA 140 03/16/2018   K 4.7 03/16/2018   CO2 27 03/16/2018   GLUCOSE 88 03/16/2018   BUN 11 03/16/2018   CREATININE 0.90 03/16/2018   BILITOT 0.6 03/16/2018   ALKPHOS 79 11/21/2016   AST 15 03/16/2018   ALT 11 03/16/2018   PROT 6.7 03/16/2018   ALBUMIN 4.7 11/21/2016   CALCIUM 9.9 03/16/2018   Lab Results  Component Value Date   CHOL 177 03/16/2018   Lab Results  Component Value Date   HDL 47 03/16/2018   Lab Results  Component Value Date   LDLCALC 114 (H) 03/16/2018   Lab Results  Component Value Date   TRIG 67 03/16/2018   Lab Results  Component Value Date   CHOLHDL 3.8 03/16/2018   No results found for: HGBA1C    Assessment & Plan:   Problem List Items Addressed This Visit    None    Visit Diagnoses    Adult general medical  exam    -  Primary   Relevant Orders   Lipid panel   CBC with Differential/Platelet   COMPLETE METABOLIC PANEL WITH GFR   VITAMIN D 25 Hydroxy (Vit-D Deficiency, Fractures)   Lipid screening       Relevant Orders   Lipid panel   Screening for metabolic disorder       Relevant Orders   CBC with Differential/Platelet   COMPLETE METABOLIC PANEL WITH GFR   Vitamin D deficiency       Relevant Orders   VITAMIN D 25 Hydroxy (Vit-D Deficiency, Fractures)   Need for hepatitis C screening test       Relevant Orders   Hepatitis C antibody   Eczema of external ear, bilateral        Drink plenty of water daily  Get at least 20 minutes of exercise at least 4 times per week  Eat a  diet rich in fiber, no more than 2 grams of sodium per day, low fat/cholesterol, sugar, and carbohydrate, lean meat.   Lab work completed today.   Consider COVID vaccination   Use over the counter medication for toenail fungus of right great toenail if no resolution contact clinic for prescription  For bilateral knee pain that has been chronic and you have completed workup have records transferred for PCP review per your desire. You may use daily Tylenol arthritis as needed, Voltaren topical over the counter, and knee brace at night for compression and support, alternate ice and heat as this may help to decrease your symptoms. Consider orthopedic referral request   Blood pressure goal 140/90 or less seek medical attention if out of this range    No orders of the defined types were placed in this encounter.   Follow-up: Return in about 1 year (around 10/11/2020).    Elmore Guiserystal A Amal Renbarger, FNP

## 2019-10-12 NOTE — Patient Instructions (Addendum)
Drink plenty of water daily  Get at least 20 minutes of exercise at least 4 times per week  Eat a diet rich in fiber, no more than 2 grams of sodium per day, low fat/cholesterol, sugar, and carbohydrate, lean meat.   Lab work completed today.   Consider COVID vaccination   Use over the counter medication for toenail fungus of right great toenail if no resolution contact clinic for prescription  For bilateral knee pain that has been chronic and you have completed workup have records transferred for PCP review per your desire. You may use daily Tylenol arthritis as needed, Voltaren topical over the counter, and knee brace at night for compression and support, alternate ice and heat as this may help to decrease your symptoms. Consider orthopedic referral request   Blood pressure goal 140/90 or less seek medical attention if out of this range

## 2019-10-12 NOTE — Telephone Encounter (Signed)
Please call the pt and ask the medication dose route directions exactly for the mometasone furoate 0.1% 64ml bilateral ear that he uses for eczema it was not put into his meds as taking and he is requesting refil

## 2019-10-12 NOTE — Telephone Encounter (Signed)
Pt states he is on Mometasone Furoate .01% topical solution (lotion) bid for 3 weeks.

## 2019-10-13 ENCOUNTER — Other Ambulatory Visit: Payer: Self-pay | Admitting: Nurse Practitioner

## 2019-10-13 DIAGNOSIS — H60543 Acute eczematoid otitis externa, bilateral: Secondary | ICD-10-CM

## 2019-10-13 LAB — CBC WITH DIFFERENTIAL/PLATELET
Absolute Monocytes: 488 cells/uL (ref 200–950)
Basophils Absolute: 18 cells/uL (ref 0–200)
Basophils Relative: 0.4 %
Eosinophils Absolute: 51 cells/uL (ref 15–500)
Eosinophils Relative: 1.1 %
HCT: 46.3 % (ref 38.5–50.0)
Hemoglobin: 15.5 g/dL (ref 13.2–17.1)
Lymphs Abs: 1748 cells/uL (ref 850–3900)
MCH: 29.8 pg (ref 27.0–33.0)
MCHC: 33.5 g/dL (ref 32.0–36.0)
MCV: 88.9 fL (ref 80.0–100.0)
MPV: 10 fL (ref 7.5–12.5)
Monocytes Relative: 10.6 %
Neutro Abs: 2295 cells/uL (ref 1500–7800)
Neutrophils Relative %: 49.9 %
Platelets: 166 10*3/uL (ref 140–400)
RBC: 5.21 10*6/uL (ref 4.20–5.80)
RDW: 12.3 % (ref 11.0–15.0)
Total Lymphocyte: 38 %
WBC: 4.6 10*3/uL (ref 3.8–10.8)

## 2019-10-13 LAB — COMPLETE METABOLIC PANEL WITH GFR
AG Ratio: 2.1 (calc) (ref 1.0–2.5)
ALT: 16 U/L (ref 9–46)
AST: 18 U/L (ref 10–40)
Albumin: 4.8 g/dL (ref 3.6–5.1)
Alkaline phosphatase (APISO): 71 U/L (ref 36–130)
BUN: 15 mg/dL (ref 7–25)
CO2: 26 mmol/L (ref 20–32)
Calcium: 9.4 mg/dL (ref 8.6–10.3)
Chloride: 102 mmol/L (ref 98–110)
Creat: 0.94 mg/dL (ref 0.60–1.35)
GFR, Est African American: 116 mL/min/{1.73_m2} (ref >=60)
GFR, Est Non African American: 100 mL/min/{1.73_m2} (ref >=60)
Globulin: 2.3 g/dL (calc) (ref 1.9–3.7)
Glucose, Bld: 88 mg/dL (ref 65–99)
Potassium: 4.4 mmol/L (ref 3.5–5.3)
Sodium: 137 mmol/L (ref 135–146)
Total Bilirubin: 0.7 mg/dL (ref 0.2–1.2)
Total Protein: 7.1 g/dL (ref 6.1–8.1)

## 2019-10-13 LAB — VITAMIN D 25 HYDROXY (VIT D DEFICIENCY, FRACTURES): Vit D, 25-Hydroxy: 23 ng/mL — ABNORMAL LOW (ref 30–100)

## 2019-10-13 LAB — LIPID PANEL
Cholesterol: 187 mg/dL (ref <200)
HDL: 48 mg/dL (ref >=40)
LDL Cholesterol (Calc): 125 mg/dL (calc) — ABNORMAL HIGH
Non-HDL Cholesterol (Calc): 139 mg/dL (calc) — ABNORMAL HIGH (ref <130)
Total CHOL/HDL Ratio: 3.9 (calc) (ref <5.0)
Triglycerides: 53 mg/dL (ref <150)

## 2019-10-13 LAB — HEPATITIS C ANTIBODY
Hepatitis C Ab: NONREACTIVE
SIGNAL TO CUT-OFF: 0.01 (ref <1.00)

## 2019-10-13 MED ORDER — MOMETASONE FUROATE 0.1 % EX SOLN: Freq: Every day | CUTANEOUS | 0 refills | Status: DC

## 2019-10-13 MED FILL — MOMETASONE FUROATE 0.1% SOL: 0.1 | 15 days supply | Qty: 60 | Fill #0

## 2019-10-13 NOTE — Progress Notes (Signed)
Cbc and cmp are okay. LDL cholesterol is a bit high watch bad cholesterol and fats in diet.   Vitamin d remains low he should be taking a daily d3 at least 1000mg  daily ongoing.   Follow up as planned.

## 2019-12-01 ENCOUNTER — Ambulatory Visit: Payer: 59 | Attending: Internal Medicine

## 2019-12-01 DIAGNOSIS — Z23 Encounter for immunization: Secondary | ICD-10-CM

## 2019-12-01 NOTE — Progress Notes (Signed)
   Covid-19 Vaccination Clinic  Name:  Zachary Myers    MRN: [id number] DOB: 1980-12-27  12/01/2019  Mr. Zachary Myers was observed post Covid-19 immunization for 15 minutes without incident. He was provided with Vaccine Information Sheet and instruction to access the V-Safe system.   Mr. Zachary Myers was instructed to call 911 with any severe reactions post vaccine: Marland Kitchen Difficulty breathing  . Swelling of face and throat  . A fast heartbeat  . A bad rash all over body  . Dizziness and weakness   Immunizations Administered    Name Date Dose VIS Date Route   Pfizer COVID-19 Vaccine 12/01/2019 11:39 AM 0.3 mL 05/25/2018 Intramuscular   Manufacturer: [employer], [employer]   Lot: [id number]   NDC: [address]-0

## 2019-12-23 ENCOUNTER — Ambulatory Visit
Admission: EM | Admit: 2019-12-23 | Discharge: 2019-12-23 | Disposition: A | Discharge status: Home / Self Care | Payer: 59 | Attending: Emergency Medicine | Admitting: Emergency Medicine

## 2019-12-23 DIAGNOSIS — R059 Cough, unspecified: Secondary | ICD-10-CM

## 2019-12-23 DIAGNOSIS — J0100 Acute maxillary sinusitis, unspecified: Secondary | ICD-10-CM | POA: Diagnosis not present

## 2019-12-23 MED ORDER — AZITHROMYCIN 250 MG PO TABS: 250.0000 mg | ORAL_TABLET | Freq: Every day | ORAL | 0 refills | Status: DC

## 2019-12-23 MED ORDER — GUAIFENESIN-CODEINE 100-10 MG/5ML PO SOLN: 5.0000 mL | Freq: Three times a day (TID) | ORAL | 0 refills | Status: DC | PRN

## 2019-12-23 NOTE — ED Provider Notes (Signed)
Renaldo Fiddler    CSN: [id number] Arrival date & time: 12/23/19  2297      History   Chief Complaint Chief Complaint  Patient presents with  . Cough  . Nasal Congestion  . Hoarse  . Sinus Problem    HPI Zachary Myers is a 39 y.o. male.   Patient presents with nonproductive cough, sinus pressure, congestion x1 week.  He was hoarse when his symptoms first started but this has resolved.  He states the cough is disturbing his sleep.  He has been taking Tylenol and Sudafed for his symptoms.  He denies fever, sore throat, rash, shortness of breath, vomiting, diarrhea, or other symptoms.  The history is provided by the patient.    Past Medical History:  Diagnosis Date  . History of chicken pox   . IBS (irritable bowel syndrome)     Patient Active Problem List   Diagnosis Date Noted  . Chronic fatigue 08/14/2017  . Perianal dermatitis 08/14/2017  . Chronic pain of right knee 08/14/2017  . IBS (irritable bowel syndrome)     History reviewed. No pertinent surgical history.     Home Medications    Prior to Admission medications   Medication Sig Start Date End Date Taking? Authorizing Provider  azithromycin (ZITHROMAX) 250 MG tablet Take 1 tablet (250 mg total) by mouth daily. Take first 2 tablets together, then 1 every day until finished. 12/23/19   Mickie Bail, NP  guaiFENesin-codeine 100-10 MG/5ML syrup Take 5 mLs by mouth 3 (three) times daily as needed for cough. 12/23/19   Mickie Bail, NP  mometasone (ELOCON) 0.1 % lotion Apply topically daily. Apply topically to the affected area daily for 3 weeks 10/13/19   Elmore Guise, FNP    Family History Family History  Problem Relation Age of Onset  . Heart disease Maternal Grandfather   . Diabetes Paternal Grandmother   . Heart disease Paternal Grandmother     Social History Social History   Tobacco Use  . Smoking status: Former Smoker    Years: 15.00    Types: Cigarettes    Quit date:  08/16/2009    Years since quitting: 10.3  . Smokeless tobacco: Former Clinical biochemist  . Vaping Use: Never used  Substance Use Topics  . Alcohol use: Yes    Alcohol/week: 3.0 standard drinks    Types: 3 Cans of beer per week  . Drug use: No     Allergies   Penicillins   Review of Systems Review of Systems  Constitutional: Negative for chills and fever.  HENT: Positive for congestion and sinus pressure. Negative for ear pain and sore throat.   Eyes: Negative for pain and visual disturbance.  Respiratory: Positive for cough. Negative for shortness of breath.   Cardiovascular: Negative for chest pain and palpitations.  Gastrointestinal: Negative for abdominal pain and vomiting.  Genitourinary: Negative for dysuria and hematuria.  Musculoskeletal: Negative for arthralgias and back pain.  Skin: Negative for color change and rash.  Neurological: Negative for seizures and syncope.  All other systems reviewed and are negative.    Physical Exam Triage Vital Signs ED Triage Vitals [12/23/19 0943]  Enc Vitals Group     BP      Pulse      Resp      Temp      Temp src      SpO2      Weight      Height  Head Circumference      Peak Flow      Pain Score 3     Pain Loc      Pain Edu?      Excl. in GC?    No data found.  Updated Vital Signs BP (!) 128/97   Pulse 73   Temp 98.6 F (37 C)   Resp 14   SpO2 96%   Visual Acuity Right Eye Distance:   Left Eye Distance:   Bilateral Distance:    Right Eye Near:   Left Eye Near:    Bilateral Near:     Physical Exam Vitals and nursing note reviewed.  Constitutional:      General: He is not in acute distress.    Appearance: He is well-developed.  HENT:     Head: Normocephalic and atraumatic.     Right Ear: Tympanic membrane normal.     Left Ear: Tympanic membrane normal.     Nose: Nose normal.     Mouth/Throat:     Mouth: Mucous membranes are moist.     Pharynx: Oropharynx is clear.  Eyes:      Conjunctiva/sclera: Conjunctivae normal.  Cardiovascular:     Rate and Rhythm: Normal rate and regular rhythm.     Heart sounds: No murmur heard.   Pulmonary:     Effort: Pulmonary effort is normal. No respiratory distress.     Breath sounds: Normal breath sounds.  Abdominal:     Palpations: Abdomen is soft.     Tenderness: There is no abdominal tenderness. There is no guarding or rebound.  Musculoskeletal:     Cervical back: Neck supple.  Skin:    General: Skin is warm and dry.     Findings: No rash.  Neurological:     General: No focal deficit present.     Mental Status: He is alert and oriented to person, place, and time.     Gait: Gait normal.  Psychiatric:        Mood and Affect: Mood normal.        Behavior: Behavior normal.      UC Treatments / Results  Labs (all labs ordered are listed, but only abnormal results are displayed) Labs Reviewed  NOVEL CORONAVIRUS, NAA    EKG   Radiology No results found.  Procedures Procedures (including critical care time)  Medications Ordered in UC Medications - No data to display  Initial Impression / Assessment and Plan / UC Course  I have reviewed the triage vital signs and the nursing notes.  Pertinent labs & imaging results that were available during my care of the patient were reviewed by me and considered in my medical decision making (see chart for details).   Acute sinusitis, cough.  Treating with Zithromax and Robitussin-AC.  Precautions for drowsiness with Robitussin-AC discussed with patient.  Instructed him to follow-up with his PCP if his symptoms are not improving.  Patient agrees to plan of care.     Final Clinical Impressions(s) / UC Diagnoses   Final diagnoses:  Acute non-recurrent maxillary sinusitis  Cough     Discharge Instructions     Take the antibiotic as directed.  Take the Robitussin AC as directed; do not drive, operate machinery, or drink alcohol with this medication as it may cause  drowsiness.    Follow up with your primary care provider if your symptoms are not improving.       ED Prescriptions    Medication Sig Dispense Auth.  Provider   guaiFENesin-codeine 100-10 MG/5ML syrup Take 5 mLs by mouth 3 (three) times daily as needed for cough. 75 mL Mickie Bail, NP   azithromycin (ZITHROMAX) 250 MG tablet Take 1 tablet (250 mg total) by mouth daily. Take first 2 tablets together, then 1 every day until finished. 6 tablet Mickie Bail, NP     I have reviewed the PDMP during this encounter.   Mickie Bail, NP 12/23/19 1017

## 2019-12-23 NOTE — ED Triage Notes (Signed)
C/o cough, sinus problem, and intermittent hoarseness x1 week.

## 2019-12-23 NOTE — Discharge Instructions (Signed)
Take the antibiotic as directed.  Take the Robitussin AC as directed; do not drive, operate machinery, or drink alcohol with this medication as it may cause drowsiness.    Follow up with your primary care provider if your symptoms are not improving.

## 2019-12-25 LAB — NOVEL CORONAVIRUS, NAA: SARS-CoV-2, NAA: NOT DETECTED

## 2019-12-25 LAB — SARS-COV-2, NAA 2 DAY TAT

## 2020-01-30 ENCOUNTER — Other Ambulatory Visit: Payer: Self-pay

## 2020-01-30 ENCOUNTER — Ambulatory Visit (HOSPITAL_COMMUNITY): Admission: EM | Admit: 2020-01-30 | Discharge: 2020-01-30 | Discharge status: Against Medical Advice | Payer: 59

## 2020-03-19 ENCOUNTER — Other Ambulatory Visit (HOSPITAL_COMMUNITY): Payer: Self-pay | Admitting: Orthopedic Surgery

## 2020-03-20 MED FILL — MELOXICAM 15 MG TABLET: 15 | 30 days supply | Qty: 30 | Fill #0

## 2020-06-16 IMAGING — CR DG KNEE COMPLETE 4+V*R*
4 series · 4 of 4 positions shown · non-contrast
Comparison: None.

CLINICAL DATA: Progressive right knee pain for several years, no
known injury, initial encounter

EXAM:
RIGHT KNEE - COMPLETE 4+ VIEW

[w knee ap right]
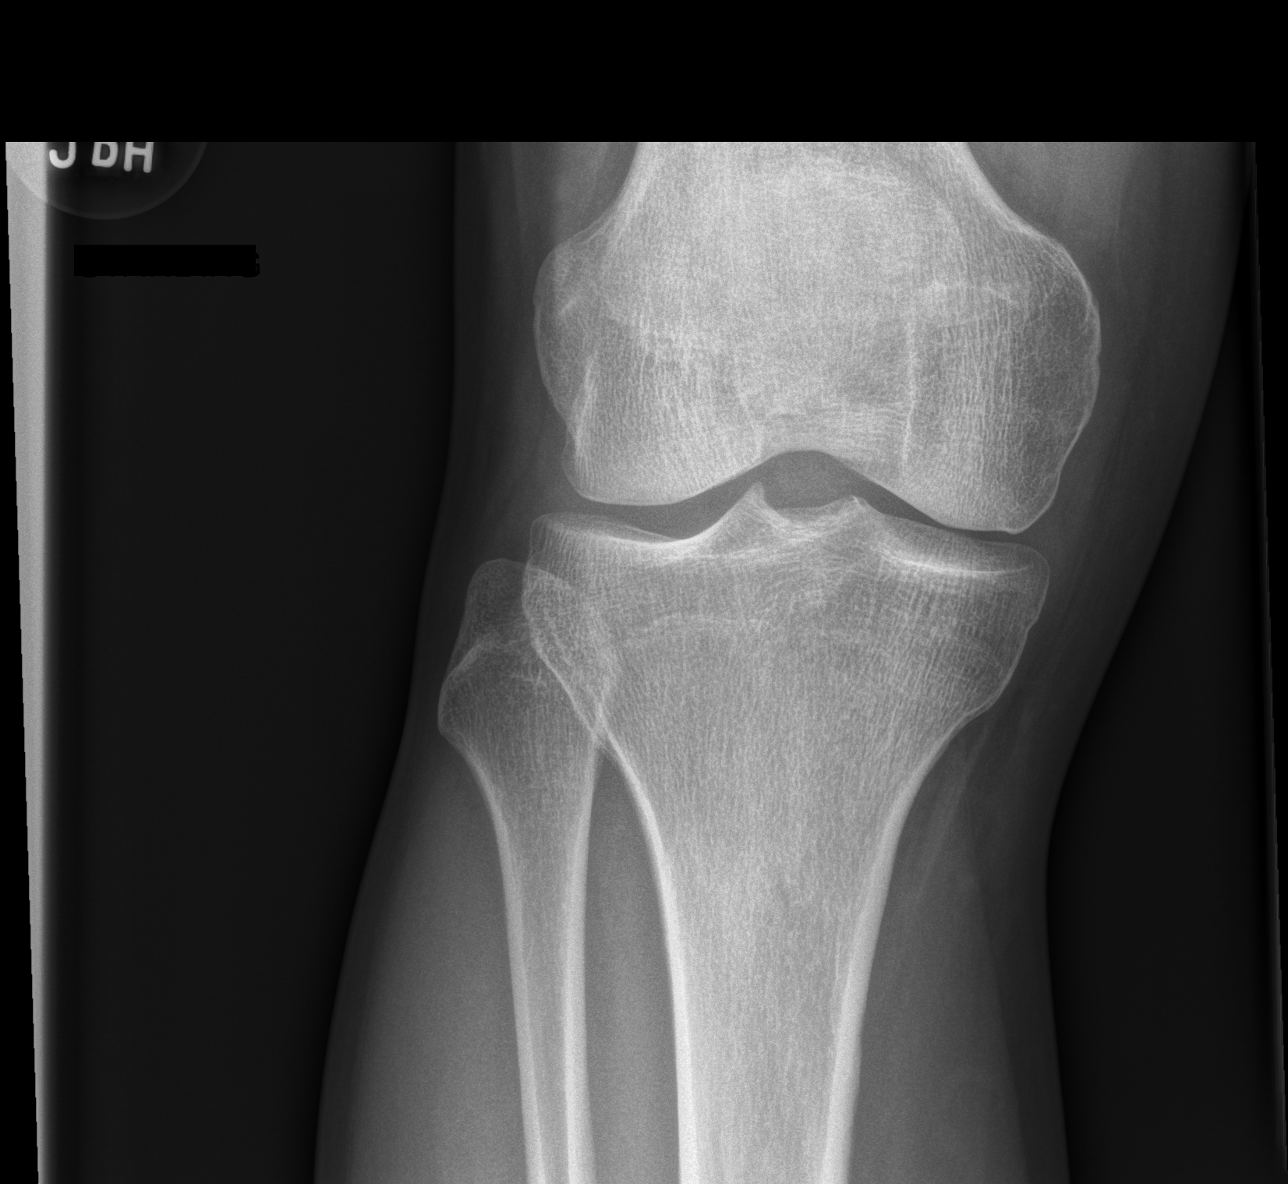

[w knee lat right]
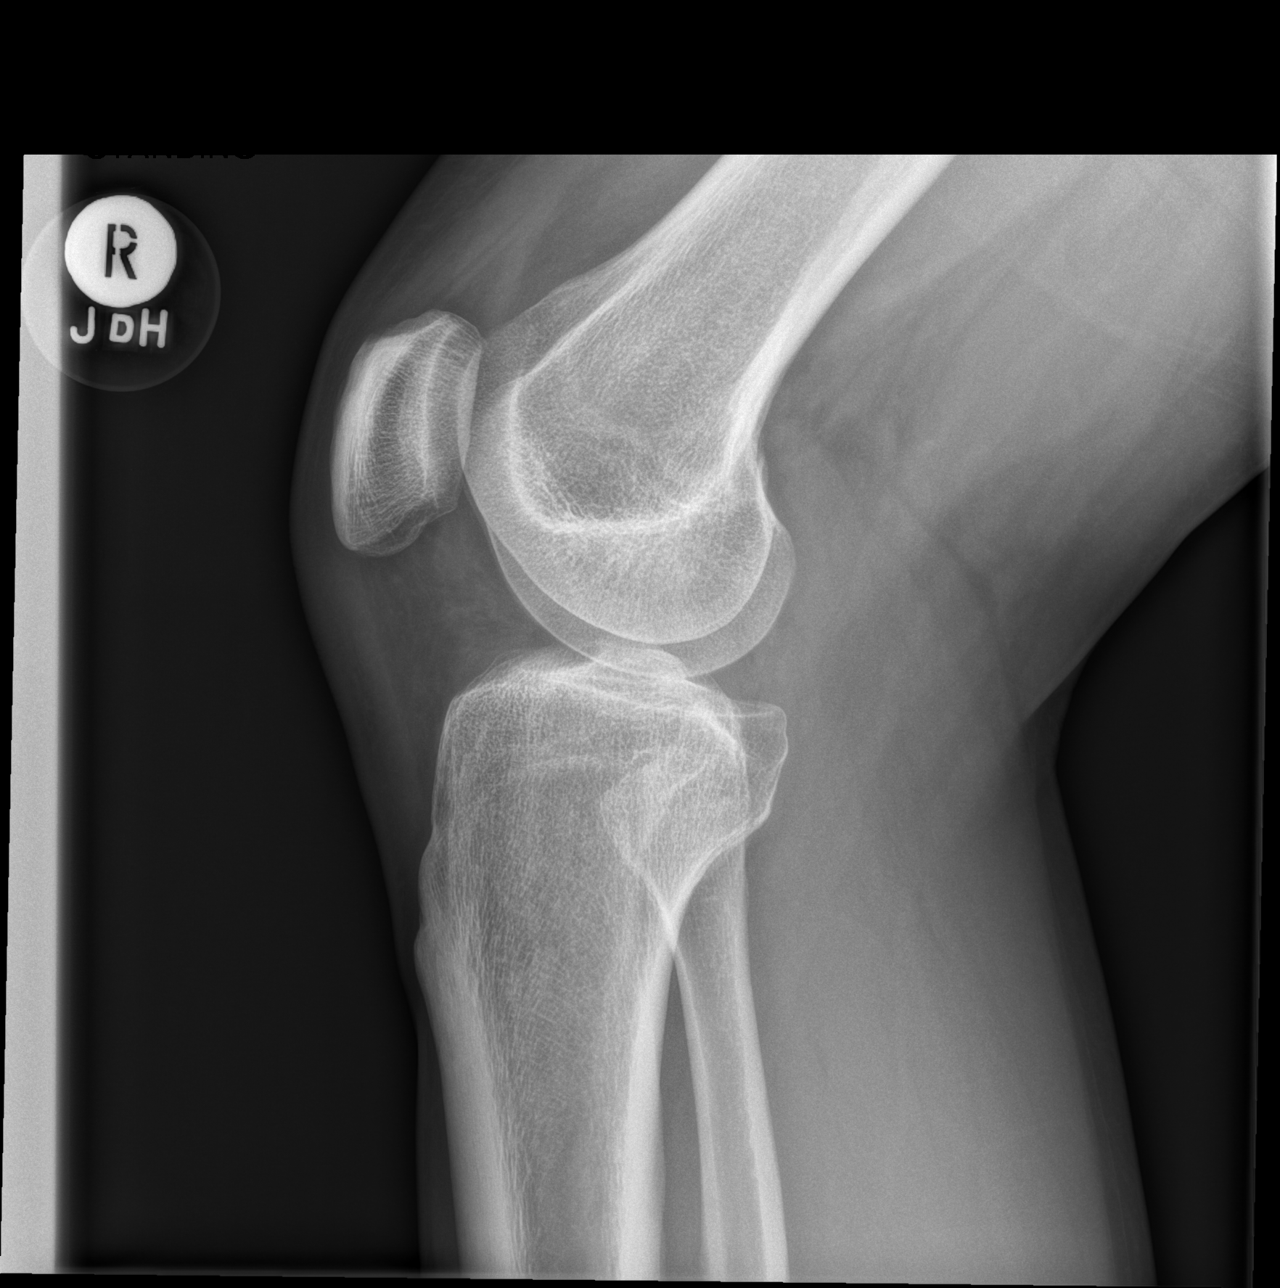

[x knee tunnel right]
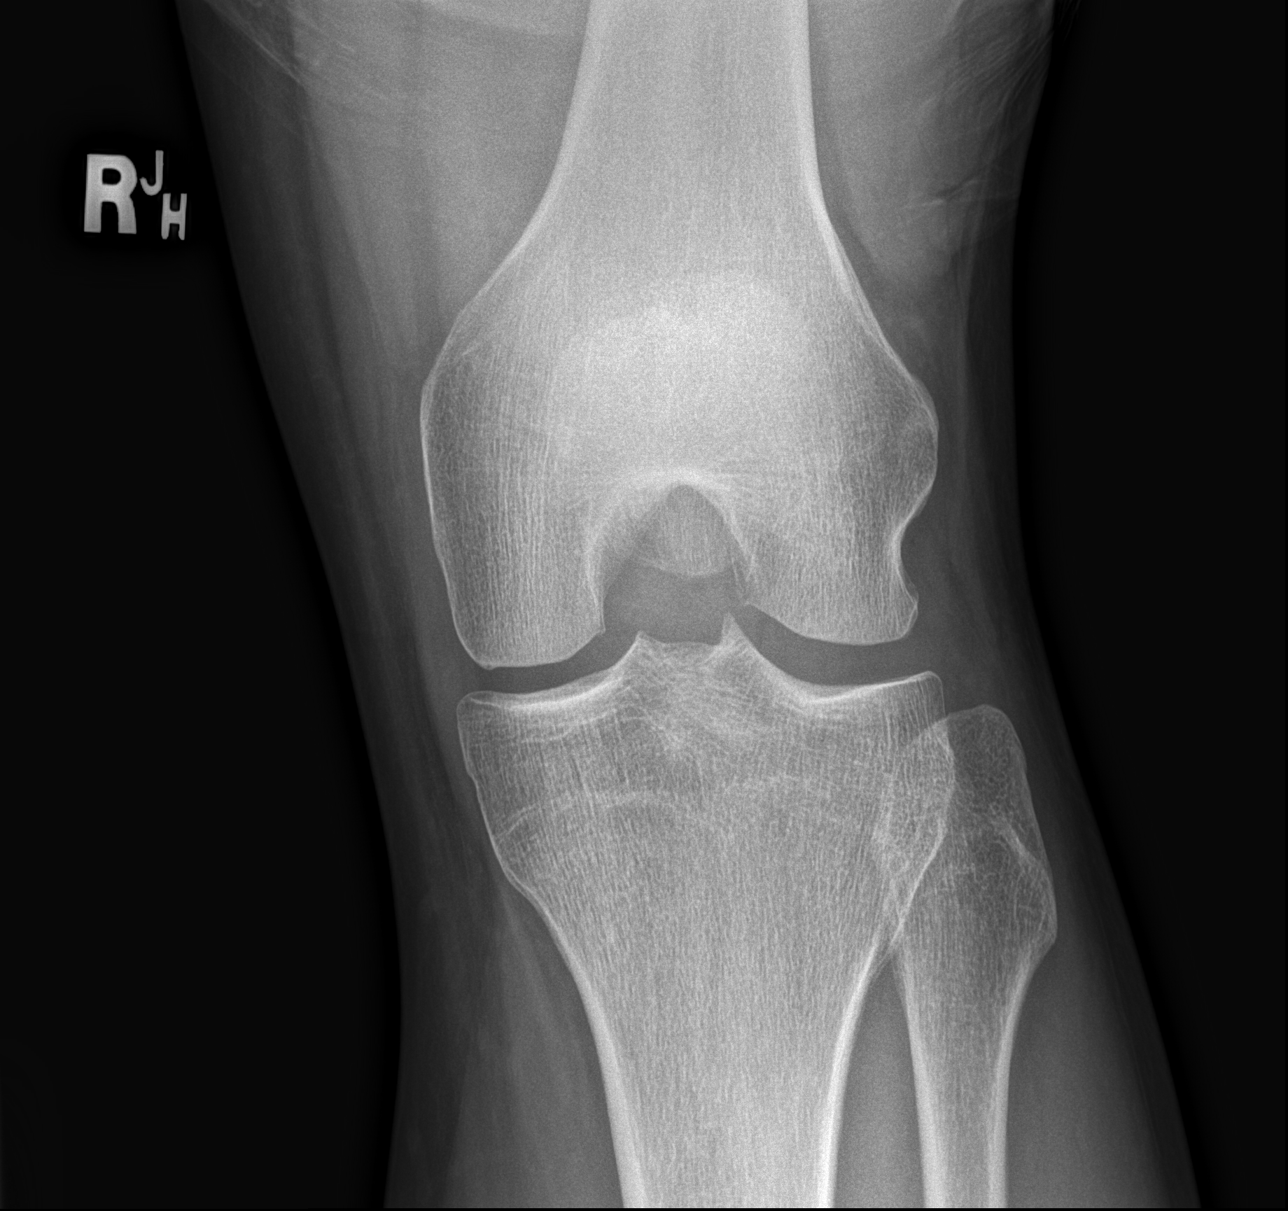

[x knee sunrise right]
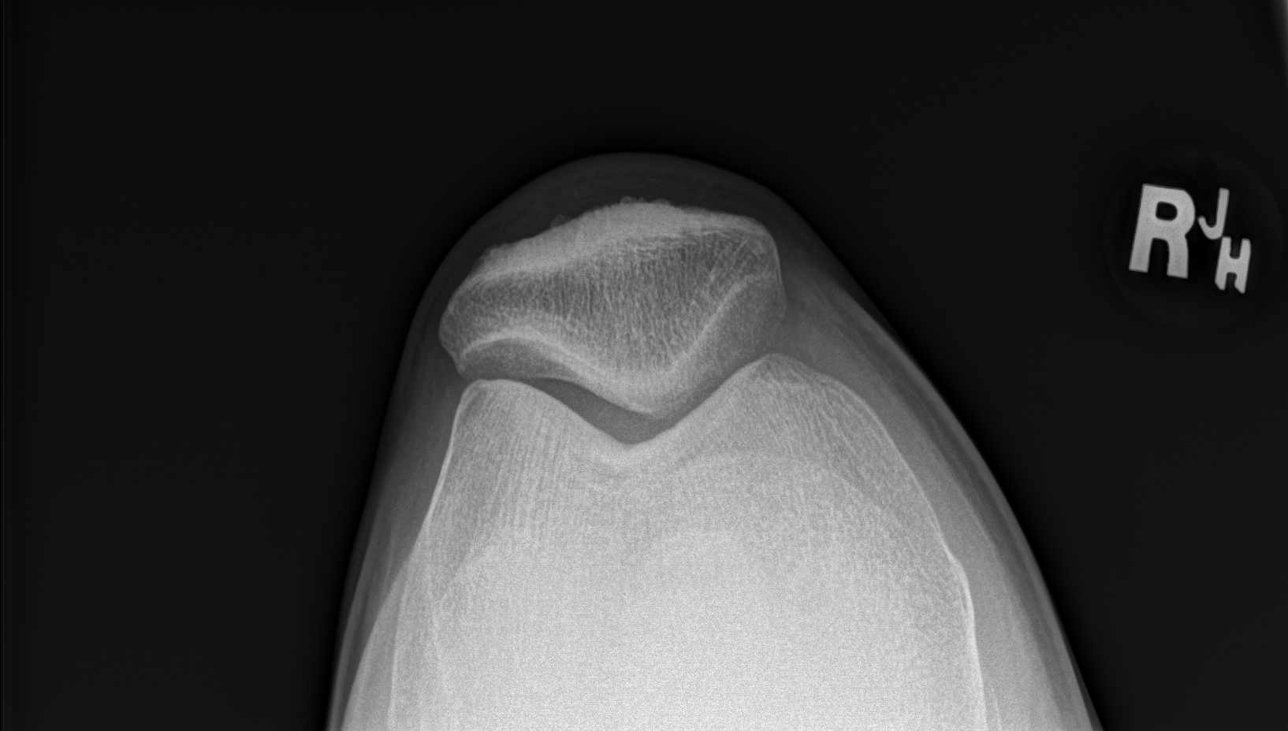

[4 of 4 positions shown; findings below may reference images not displayed]

FINDINGS: No evidence of fracture, dislocation, or joint effusion. No evidence
of arthropathy or other focal bone abnormality. Soft tissues are
unremarkable.
IMPRESSION: No acute abnormality noted.

## 2020-07-18 ENCOUNTER — Other Ambulatory Visit (HOSPITAL_COMMUNITY): Payer: Self-pay

## 2020-07-18 ENCOUNTER — Encounter: Payer: Self-pay | Admitting: Family

## 2020-07-18 ENCOUNTER — Telehealth: Payer: 59 | Admitting: Family

## 2020-07-18 DIAGNOSIS — Z20828 Contact with and (suspected) exposure to other viral communicable diseases: Secondary | ICD-10-CM

## 2020-07-18 MED ORDER — OSELTAMIVIR PHOSPHATE 75 MG PO CAPS
75.0000 mg | ORAL_CAPSULE | Freq: Two times a day (BID) | ORAL | 0 refills | Status: DC
  Filled 2020-07-18: qty 10, 5d supply, fill #0

## 2020-07-18 MED ORDER — OSELTAMIVIR PHOSPHATE 75 MG PO CAPS
75.0000 mg | ORAL_CAPSULE | Freq: Every day | ORAL | 0 refills | Status: DC
  Filled 2020-07-18: qty 10, 10d supply, fill #0

## 2020-07-18 NOTE — Progress Notes (Addendum)
   Virtual Visit  Note Due to COVID-19 pandemic this visit was conducted virtually. This visit type was conducted due to national recommendations for restrictions regarding the COVID-19 Pandemic (e.g. social distancing, sheltering in place) in an effort to limit this patient's exposure and mitigate transmission in our community. All issues noted in this document were discussed and addressed.  A physical exam was not performed with this format.  I connected with Zachary Myers on 07/18/20 at 9:10 AM by telephone and verified that I am speaking with the correct person using two identifiers. Zachary Myers is currently located at car and no one is currently with him  during visit. The provider, Jannifer Rodney, FNP is located in their office at time of visit.  I discussed the limitations, risks, security and privacy concerns of performing an evaluation and management service by telephone and the availability of in person appointments. I also discussed with the patient that there may be a patient responsible charge related to this service. The patient expressed understanding and agreed to proceed.   History and Present Illness:  HPI Pt presents to the office today with requesting Tamiflu. States over the last week and weekend his three children and wife has had the flu with cough, fever, and chills. He has not had any symptoms. Denies fevers.  He states no one in his family has tested for positive for flu, however, a child in his son daycare was tested positive.   Review of Systems  All other systems reviewed and are negative.    Observations/Objective: No SOB Or distress noted, looks healthy  Assessment and Plan: 1. Exposure to the flu Discussed possible side effects Force fluids Encouraged rest and good hand hygiene I also discussed that Tamiflu will only work on Influenza not other viruses.  - oseltamivir (TAMIFLU) 75 MG capsule; Take 1 capsule (75 mg total) by mouth  daily.   Dispense: 10 capsule; Refill: 0     I discussed the assessment and treatment plan with the patient. The patient was provided an opportunity to ask questions and all were answered. The patient agreed with the plan and demonstrated an understanding of the instructions.   The patient was advised to call back or seek an in-person evaluation if the symptoms worsen or if the condition fails to improve as anticipated.  The above assessment and management plan was discussed with the patient. The patient verbalized understanding of and has agreed to the management plan. Patient is aware to call the clinic if symptoms persist or worsen. Patient is aware when to return to the clinic for a follow-up visit. Patient educated on when it is appropriate to go to the emergency department.   Time call ended:  9:22 pm   I provided 12 minutes of  face-to-face time during this encounter.    Jannifer Rodney, FNP

## 2020-07-18 NOTE — Addendum Note (Signed)
Addended by: Jannifer Rodney A on: 07/18/2020 09:31 AM   Modules accepted: Orders

## 2021-10-24 ENCOUNTER — Other Ambulatory Visit: Payer: Self-pay

## 2021-10-24 ENCOUNTER — Encounter (HOSPITAL_COMMUNITY): Payer: Self-pay | Admitting: Emergency Medicine

## 2021-10-24 ENCOUNTER — Other Ambulatory Visit (HOSPITAL_COMMUNITY): Payer: Self-pay

## 2021-10-24 ENCOUNTER — Ambulatory Visit (HOSPITAL_COMMUNITY)
Admission: EM | Admit: 2021-10-24 | Discharge: 2021-10-24 | Disposition: A | Discharge status: Home / Self Care | Payer: 59

## 2021-10-24 DIAGNOSIS — H608X1 Other otitis externa, right ear: Secondary | ICD-10-CM

## 2021-10-24 DIAGNOSIS — H6691 Otitis media, unspecified, right ear: Secondary | ICD-10-CM | POA: Diagnosis not present

## 2021-10-24 MED ORDER — DOXYCYCLINE HYCLATE 100 MG PO CAPS
100.0000 mg | ORAL_CAPSULE | Freq: Two times a day (BID) | ORAL | 0 refills | Status: AC
  Filled 2021-10-24: qty 10, 5d supply, fill #0

## 2021-10-24 MED ORDER — OFLOXACIN 0.3 % OT SOLN
5.0000 [drp] | Freq: Two times a day (BID) | OTIC | 0 refills | Status: DC
  Filled 2021-10-24: qty 5, 10d supply, fill #0

## 2021-10-24 NOTE — Discharge Instructions (Addendum)
Please take medication as prescribed. I recommend to take with food.  You can use the ear drops twice daily.  Please return to the urgent care if symptoms persist. Please go to the emergency department if symptoms worsen.

## 2021-10-24 NOTE — ED Triage Notes (Signed)
Hit with a surfboard .  [place] and ear was irrigated and pain developed.  Patient returned home Friday.  Patient did flush ear and did report wax came out.  Patient reports pain , pressure, muffled hearing in right ear

## 2021-10-24 NOTE — ED Provider Notes (Signed)
[facility]    CSN: [id number] Arrival date & time: 10/24/21  0160      History   Chief Complaint Chief Complaint  Patient presents with   Ear Fullness    HPI Zachary Myers is a 41 y.o. male.  Presents with right ear pain and muffled hearing since last week.  Report he was hit in the ear with a surfboard, got lots of water in his ear at the beach.  Was seen at urgent care and given Omnicef.  He is currently on day 4 of the medicine.   Reports symptoms have not improved, may have even worsened. Rinsed his ears out at home and noticed a lot of wax drainage.  Some clear drainage from the ear.  Denies any fever, chills, sore throat.  Past Medical History:  Diagnosis Date   History of chicken pox    IBS (irritable bowel syndrome)     Patient Active Problem List   Diagnosis Date Noted   Chronic fatigue 08/14/2017   Perianal dermatitis 08/14/2017   Chronic pain of right knee 08/14/2017   IBS (irritable bowel syndrome)     History reviewed. No pertinent surgical history.     Home Medications    Prior to Admission medications   Medication Sig Start Date End Date Taking? Authorizing Provider  doxycycline (VIBRAMYCIN) 100 MG capsule Take 1 capsule (100 mg total) by mouth 2 (two) times daily. 10/24/21  Yes Audie Wieser, Lurena Joiner, PA-C  ofloxacin (FLOXIN) 0.3 % OTIC solution Place 5 drops into the right ear 2 (two) times daily. 10/24/21  Yes Serena Petterson, Lurena Joiner, PA-C    Family History Family History  Problem Relation Age of Onset   Heart disease Maternal Grandfather    Diabetes Paternal Grandmother    Heart disease Paternal Grandmother     Social History Social History   Tobacco Use   Smoking status: Former    Years: 15.00    Types: Cigarettes    Quit date: 08/16/2009    Years since quitting: 12.1   Smokeless tobacco: Former  Building services engineer Use: Never used  Substance Use Topics   Alcohol use: Yes    Alcohol/week: 3.0 standard drinks of alcohol     Types: 3 Cans of beer per week   Drug use: No     Allergies   Penicillins   Review of Systems Review of Systems   Physical Exam Triage Vital Signs ED Triage Vitals  Enc Vitals Group     BP 10/24/21 1021 130/89     Pulse Rate 10/24/21 1021 71     Resp 10/24/21 1021 18     Temp 10/24/21 1021 98.3 F (36.8 C)     Temp Source 10/24/21 1021 Oral     SpO2 10/24/21 1021 97 %     Weight --      Height --      Head Circumference --      Peak Flow --      Pain Score 10/24/21 1019 3     Pain Loc --      Pain Edu? --      Excl. in GC? --    No data found.  Updated Vital Signs BP 130/89 (BP Location: Left Arm)   Pulse 71   Temp 98.3 F (36.8 C) (Oral)   Resp 18   SpO2 97%     Physical Exam Vitals and nursing note reviewed.  Constitutional:      General: He is  not in acute distress. HENT:     Right Ear: External ear normal. There is no impacted cerumen. No foreign body. No mastoid tenderness. Tympanic membrane is erythematous and bulging. Tympanic membrane is not perforated.     Left Ear: Tympanic membrane, ear canal and external ear normal. There is no impacted cerumen. Tympanic membrane is not perforated.     Nose: Nose normal.     Mouth/Throat:     Mouth: Mucous membranes are moist.     Pharynx: Oropharynx is clear.  Eyes:     Extraocular Movements: Extraocular movements intact.     Conjunctiva/sclera: Conjunctivae normal.     Pupils: Pupils are equal, round, and reactive to light.  Cardiovascular:     Rate and Rhythm: Normal rate and regular rhythm.     Heart sounds: Normal heart sounds.  Pulmonary:     Effort: Pulmonary effort is normal.     Breath sounds: Normal breath sounds.  Lymphadenopathy:     Cervical: No cervical adenopathy.  Neurological:     Mental Status: He is alert and oriented to person, place, and time.     UC Treatments / Results  Labs (all labs ordered are listed, but only abnormal results are displayed) Labs Reviewed - No data to  display  EKG  Radiology No results found.  Procedures Procedures   Medications Ordered in UC Medications - No data to display  Initial Impression / Assessment and Plan / UC Course  I have reviewed the triage vital signs and the nursing notes.  Pertinent labs & imaging results that were available during my care of the patient were reviewed by me and considered in my medical decision making (see chart for details).  Right ear otitis media with possible externa as well.  Omnicef does not seem to be helping. Will change to doxycycline due to penicillin allergy.  Also ofloxacin drops to cover for possible otitis externa.  Can follow-up with primary care or return to urgent care if symptoms persist.  Patient agrees with plan he is discharged in stable condition.  Final Clinical Impressions(s) / UC Diagnoses   Final diagnoses:  Right otitis media, unspecified otitis media type  Other otitis externa, right ear     Discharge Instructions      Please take medication as prescribed. I recommend to take with food.  You can use the ear drops twice daily.  Please return to the urgent care if symptoms persist. Please go to the emergency department if symptoms worsen.    ED Prescriptions     Medication Sig Dispense Auth. Provider   doxycycline (VIBRAMYCIN) 100 MG capsule Take 1 capsule (100 mg total) by mouth 2 (two) times daily. 10 capsule Emryn Flanery, PA-C   ofloxacin (FLOXIN) 0.3 % OTIC solution Place 5 drops into the right ear 2 (two) times daily. 5 mL Tyriq Moragne, Lurena Joiner, PA-C      PDMP not reviewed this encounter.   Karlis Cregg, Ray Church 10/24/21 1130

## 2021-11-17 ENCOUNTER — Telehealth: Payer: 59 | Admitting: Family

## 2021-11-17 DIAGNOSIS — H109 Unspecified conjunctivitis: Secondary | ICD-10-CM

## 2021-11-17 MED ORDER — POLYMYXIN B-TRIMETHOPRIM 10000-0.1 UNIT/ML-% OP SOLN: 1.0000 [drp] | Freq: Four times a day (QID) | OPHTHALMIC | 0 refills | Status: DC

## 2021-11-17 NOTE — Progress Notes (Signed)

## 2021-12-01 ENCOUNTER — Telehealth: Payer: 59 | Admitting: Nurse Practitioner

## 2021-12-01 DIAGNOSIS — H109 Unspecified conjunctivitis: Secondary | ICD-10-CM

## 2021-12-01 MED ORDER — OFLOXACIN 0.3 % OP SOLN: 1.0000 [drp] | Freq: Four times a day (QID) | OPHTHALMIC | 0 refills | Status: AC

## 2021-12-01 MED ORDER — POLYMYXIN B-TRIMETHOPRIM 10000-0.1 UNIT/ML-% OP SOLN: 1.0000 [drp] | Freq: Four times a day (QID) | OPHTHALMIC | 0 refills | Status: AC

## 2021-12-01 NOTE — Progress Notes (Signed)

## 2021-12-01 NOTE — Progress Notes (Signed)
I have spent 5 minutes in review of e-visit questionnaire, review and updating patient chart, medical decision making and response to patient.  ° °Annalie Wenner W Mj Willis, NP ° °  °

## 2021-12-01 NOTE — Addendum Note (Signed)
Addended by: Bertram Denver on: 12/01/2021 10:10 AM   Modules accepted: Orders

## 2022-09-22 ENCOUNTER — Other Ambulatory Visit: Payer: Self-pay

## 2023-03-02 ENCOUNTER — Other Ambulatory Visit (HOSPITAL_COMMUNITY): Payer: Self-pay

## 2023-03-02 MED ORDER — PREDNISONE 5 MG PO TABS
ORAL_TABLET | ORAL | 0 refills | Status: AC
  Filled 2023-03-02: qty 42, 12d supply, fill #0

## 2023-03-03 ENCOUNTER — Other Ambulatory Visit (HOSPITAL_COMMUNITY): Payer: Self-pay

## 2024-01-07 ENCOUNTER — Other Ambulatory Visit (HOSPITAL_COMMUNITY): Payer: Self-pay

## 2024-02-15 ENCOUNTER — Other Ambulatory Visit (HOSPITAL_COMMUNITY): Payer: Self-pay

## 2024-02-15 MED ORDER — HYDROCORTISONE (PERIANAL) 2.5 % EX CREA
1.0000 | TOPICAL_CREAM | Freq: Two times a day (BID) | CUTANEOUS | 1 refills | Status: AC
  Filled 2024-02-15: qty 30, 10d supply, fill #0

## 2024-02-15 MED ORDER — CYCLOBENZAPRINE HCL 5 MG PO TABS
5.0000 mg | ORAL_TABLET | Freq: Three times a day (TID) | ORAL | 0 refills | Status: DC
  Filled 2024-02-15: qty 90, 30d supply, fill #0

## 2024-02-17 ENCOUNTER — Other Ambulatory Visit (HOSPITAL_COMMUNITY): Payer: Self-pay

## 2024-03-29 ENCOUNTER — Other Ambulatory Visit (HOSPITAL_COMMUNITY): Payer: Self-pay

## 2024-03-29 MED ORDER — IBUPROFEN 800 MG PO TABS
800.0000 mg | ORAL_TABLET | Freq: Three times a day (TID) | ORAL | 0 refills | Status: AC | PRN
  Filled 2024-03-29: qty 60, 20d supply, fill #0

## 2024-03-29 MED ORDER — CYCLOBENZAPRINE HCL 10 MG PO TABS
ORAL_TABLET | ORAL | 0 refills | Status: AC
  Filled 2024-03-29: qty 30, 10d supply, fill #0

## 2024-04-01 ENCOUNTER — Other Ambulatory Visit (HOSPITAL_COMMUNITY): Payer: Self-pay

## 2024-04-01 MED ORDER — CYCLOBENZAPRINE HCL 5 MG PO TABS
5.0000 mg | ORAL_TABLET | Freq: Three times a day (TID) | ORAL | 0 refills | Status: AC | PRN
  Filled 2024-04-01: qty 30, 10d supply, fill #0
